# Patient Record
Sex: Male | Born: 1965 | Race: White | Hispanic: No | Marital: Single | Smoking: Never smoker
Health system: Southern US, Community
[De-identification: ages and names within clinical notes are randomized; demographics above are authoritative.]

---

## 2011-03-07 DIAGNOSIS — Z820 Family history of epilepsy and other diseases of the nervous system: Secondary | ICD-10-CM | POA: Insufficient documentation

## 2012-02-29 DIAGNOSIS — K4090 Unilateral inguinal hernia, without obstruction or gangrene, not specified as recurrent: Secondary | ICD-10-CM | POA: Insufficient documentation

## 2012-03-02 DIAGNOSIS — N138 Other obstructive and reflux uropathy: Secondary | ICD-10-CM | POA: Insufficient documentation

## 2014-05-29 DIAGNOSIS — G4733 Obstructive sleep apnea (adult) (pediatric): Secondary | ICD-10-CM | POA: Insufficient documentation

## 2015-08-14 DIAGNOSIS — N4340 Spermatocele of epididymis, unspecified: Secondary | ICD-10-CM | POA: Insufficient documentation

## 2018-04-06 ENCOUNTER — Other Ambulatory Visit: Payer: Self-pay | Admitting: Family Medicine

## 2018-04-06 DIAGNOSIS — R1310 Dysphagia, unspecified: Secondary | ICD-10-CM

## 2018-04-24 ENCOUNTER — Ambulatory Visit
Admission: RE | Admit: 2018-04-24 | Discharge: 2018-04-24 | Disposition: A | Discharge status: Home / Self Care | Payer: BLUE CROSS/BLUE SHIELD | Source: Ambulatory Visit | Attending: Family Medicine | Admitting: Family Medicine

## 2018-04-24 DIAGNOSIS — R1310 Dysphagia, unspecified: Secondary | ICD-10-CM | POA: Insufficient documentation

## 2018-04-26 DIAGNOSIS — K2270 Barrett's esophagus without dysplasia: Secondary | ICD-10-CM | POA: Insufficient documentation

## 2019-08-16 ENCOUNTER — Other Ambulatory Visit: Payer: Self-pay

## 2019-08-16 ENCOUNTER — Ambulatory Visit: Payer: BC Managed Care – PPO | Admitting: Urology

## 2019-08-16 ENCOUNTER — Encounter: Payer: Self-pay | Admitting: Urology

## 2019-08-16 VITALS — BP 128/88 | HR 76 | Ht 74.0 in | Wt 250.0 lb

## 2019-08-16 DIAGNOSIS — E291 Testicular hypofunction: Secondary | ICD-10-CM

## 2019-08-16 MED ORDER — CLOMIPHENE CITRATE 50 MG PO TABS: 25.0000 mg | ORAL_TABLET | Freq: Every day | ORAL | 2 refills | Status: DC

## 2019-08-16 NOTE — Progress Notes (Signed)
   08/16/2019 2:07 PM   Connor Stephens 11-Aug-1965 [id number]  Referring provider: Jerl Mina, MD [address] [facility] [place],  [place] [address]  Chief Complaint  Patient presents with  . Other    HPI: Connor Stephens is a 54 y.o. male who presents to establish local urologic care.  He has a history of symptomatic hypogonadism with symptoms of tiredness, fatigue and decreased libido.  He was initially followed by Dr. Achilles Dunk in [place] and then [facility] and was started on clomiphene with significant improvement in his testosterone levels and symptoms.  After Dr. Achilles Dunk left the area he was seeing Dr. Lindley Magnus in [facility] who recently relocated to [facility].  He has been off Clomid for approximately 3 months and does have recurrent symptoms.  He has mild to moderate lower urinary tract symptoms which are not bothersome enough that he desires treatment.  Denies dysuria or gross hematuria.  No flank, abdominal or pelvic pain.  He has a strong family history of prostate cancer.  Baseline PSA between 1.1-1.7 and last PSA performed by his PCP December 2020 was 1.44.  He has used sildenafil for ED.   PMH: No past medical history on file.  Surgical History: . Hernia repair Right  as a Child  . Tonsillectomy   Home Medications:  Allergies as of 08/16/2019   Not on File     Medication List       Accurate as of August 16, 2019  2:07 PM. If you have any questions, ask your nurse or doctor.        aspirin 81 MG EC tablet Take by mouth.   clomiPHENE 50 MG tablet Commonly known as: CLOMID Take by mouth.   sildenafil 20 MG tablet Commonly known as: REVATIO TAKE 3 TABLETS (60 MG) BY MOUTH ONCE DAILY AS NEEDED 1 HOUR BEFORE SEX (MAX 3 TABLETS/DAY)       Allergies: Not on File  Family History: No family history on file.  Social History:  has no history on file for tobacco, alcohol, and drug.   Physical Exam: BP 128/88   Pulse 76   Ht 6\' 2"  (1.88 m)   Wt 250 lb  (113.4 kg)   BMI 32.10 kg/m   Constitutional:  Alert and oriented, No acute distress. HEENT: [place] AT, moist mucus membranes.  Trachea midline, no masses. Cardiovascular: No clubbing, cyanosis, or edema. Respiratory: Normal respiratory effort, no increased work of breathing. GU: Prostate 30 g, smooth without nodules Skin: No rashes, bruises or suspicious lesions. Neurologic: Grossly intact, no focal deficits, moving all 4 extremities. Psychiatric: Normal mood and affect.   Assessment & Plan:    - Hypogonadism He desires to restart and continue clomiphene citrate.  Rx sent to pharmacy.  Since he has been off replacement for approximately 3 months a testosterone level was drawn today.  Lab visit 6 months for testosterone/hematocrit and office visit 1 year for same.  His PSA is checked annually by his PCP.   , MD  [facility] [address], [address] [place], [place] [phone]

## 2019-08-17 LAB — TESTOSTERONE: Testosterone: 223 ng/dL — ABNORMAL LOW (ref 264–916)

## 2019-08-20 ENCOUNTER — Telehealth: Payer: Self-pay | Admitting: *Deleted

## 2019-08-20 NOTE — Telephone Encounter (Signed)
-----   Message from Riki Altes, MD sent at 08/19/2019 10:00 AM EDT ----- Testosterone level was low at 223.  Clomid Rx was sent to pharmacy.  Follow-up as scheduled

## 2019-08-22 NOTE — Telephone Encounter (Signed)
Patient notified

## 2019-11-04 IMAGING — RF DG ESOPHAGUS
6 series · 15 of 16 positions shown · non-contrast
Comparison: None.

CLINICAL DATA: Dysphagia.

EXAM:
ESOPHOGRAM / BARIUM SWALLOW / BARIUM TABLET STUDY
TECHNIQUE: Combined double contrast and single contrast examination performed
using effervescent crystals, thick barium liquid, and thin barium
liquid. The patient was observed with fluoroscopy swallowing a 13 mm
barium sulphate tablet.
FLUOROSCOPY TIME:  Fluoroscopy Time:  54 seconds

[Series 1: cp_standard · 0.26mm/px · 4 of 46 frames shown (1 of 3)]
[frame 7/46]
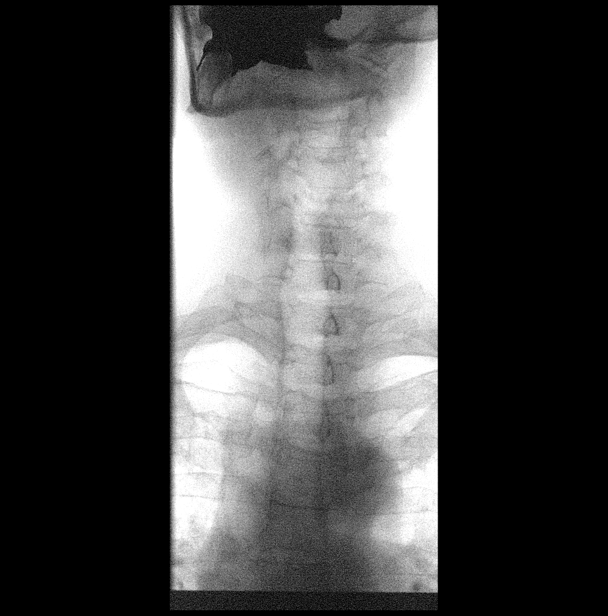
[frame 14/46]
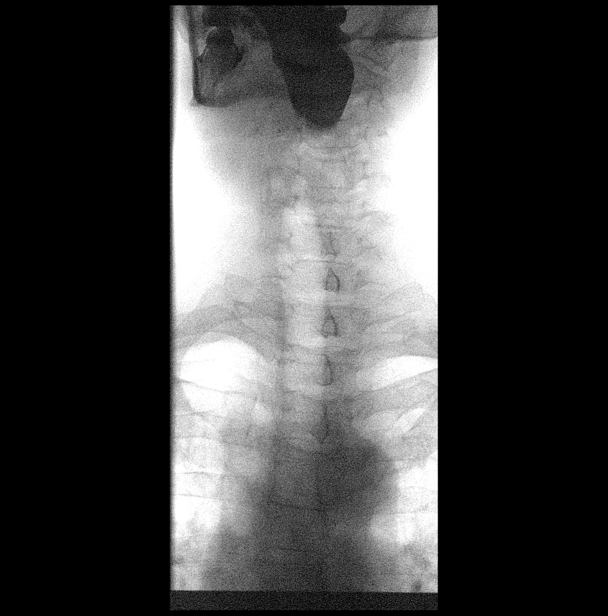
[frame 24/46]
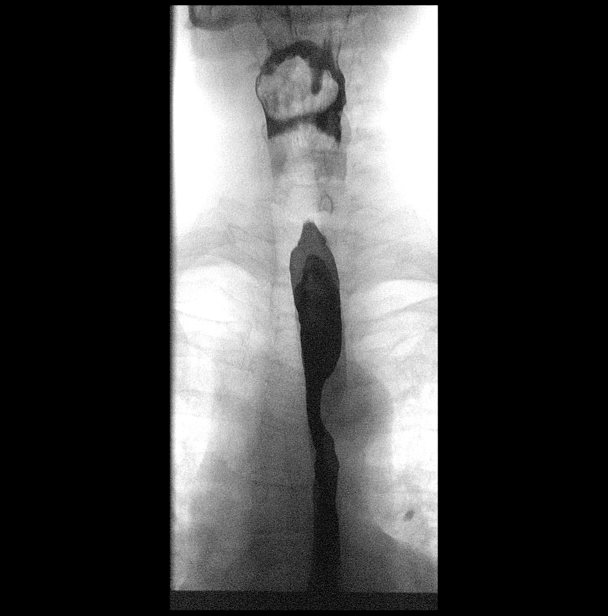
[frame 40/46]
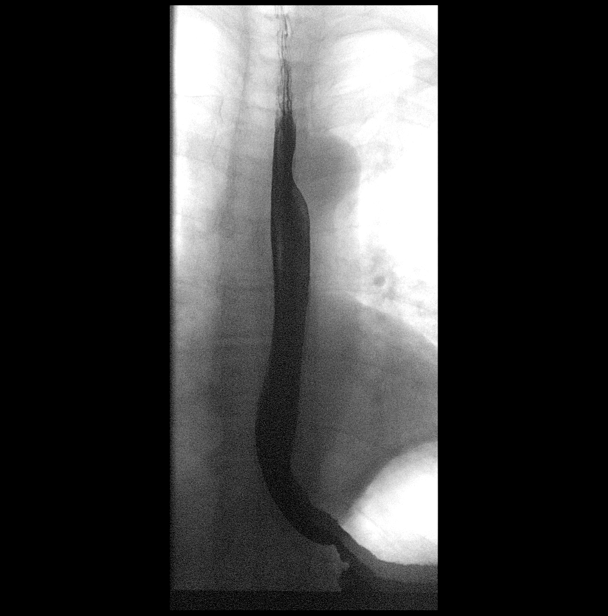

[Series 2: fluoro_barium 2fps_bw · 0.17mm/px · 2 of 2 frames shown (1 of 3)]
[frame 1/2]
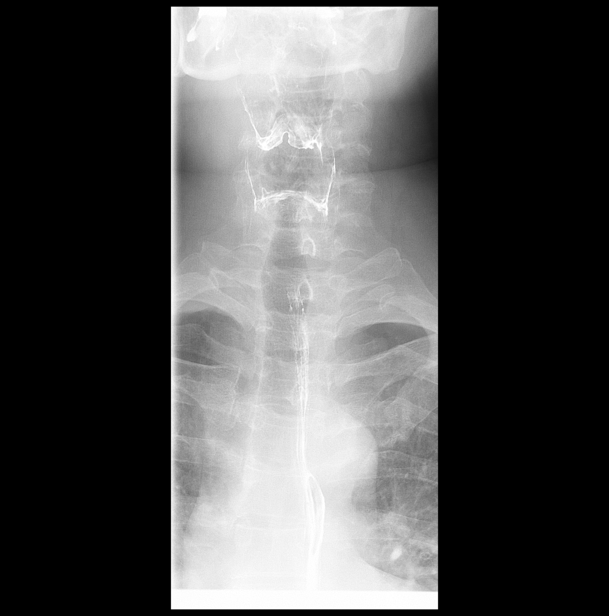
[frame 2/2]
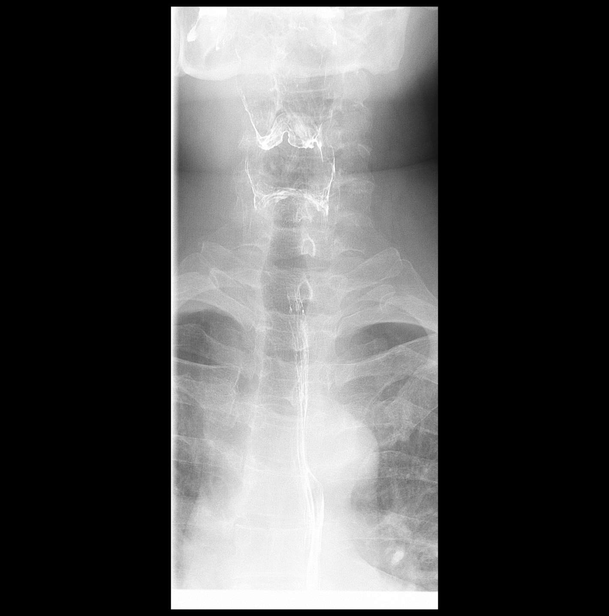

[Series 3: cp_standard · 0.26mm/px · 3 of 49 frames shown (2 of 3)]
[frame 8/49]
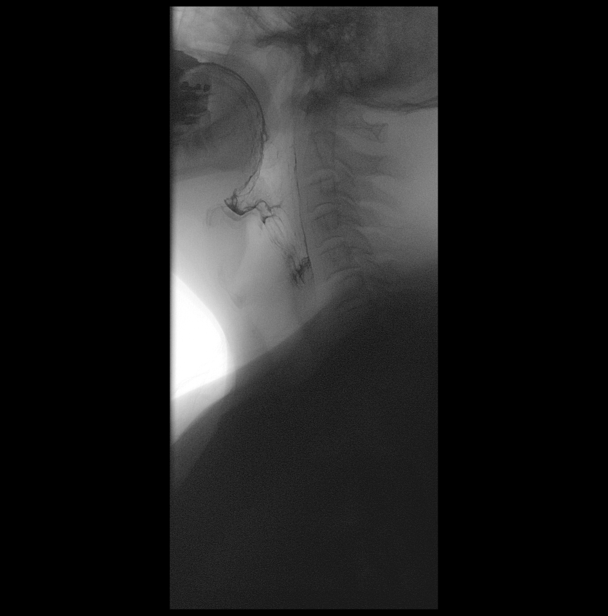
[frame 25/49]
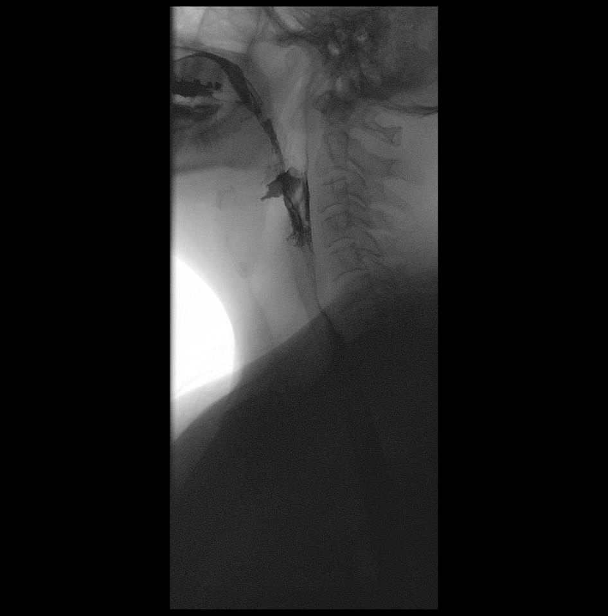
[frame 42/49]
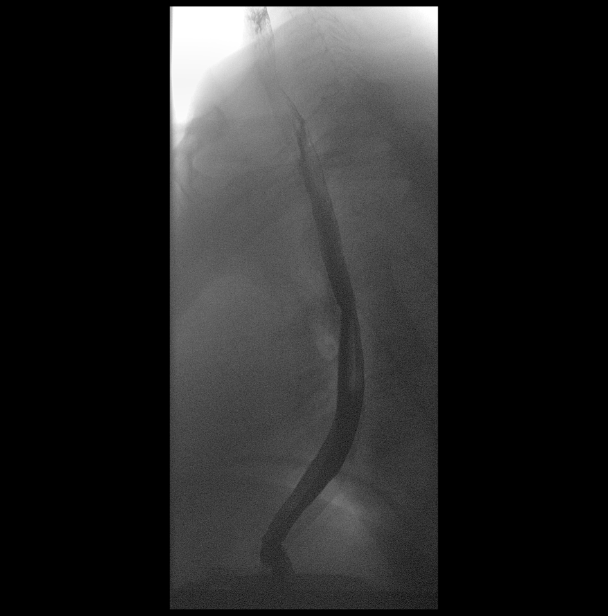

[Series 4: fluoro_barium 2fps_bw · 0.18mm/px · 1 of 1 slices shown (2 of 3)]
[im 1/1]
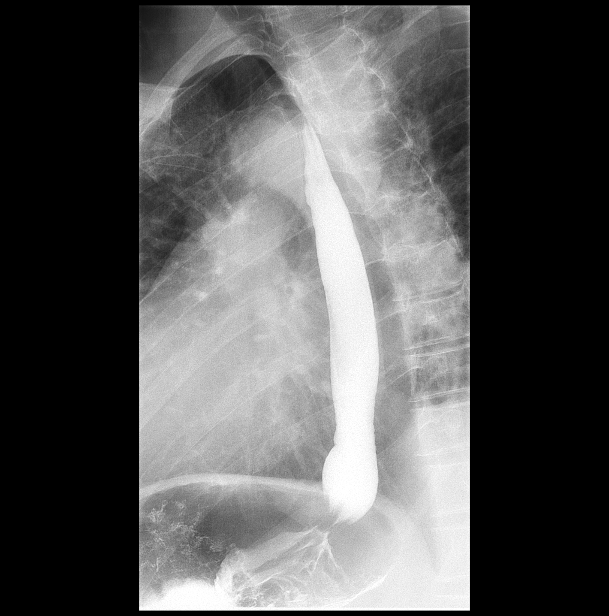

[Series 5: fluoro_barium 2fps_bw · 0.18mm/px · 1 of 1 slices shown (3 of 3)]
[im 1/1]
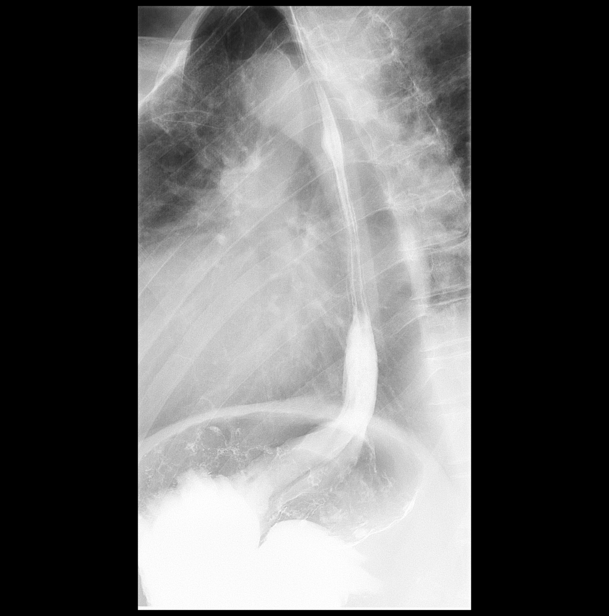

[Series 6: cp_standard · 0.27mm/px · 4 of 43 frames shown (3 of 3)]
[frame 7/43]
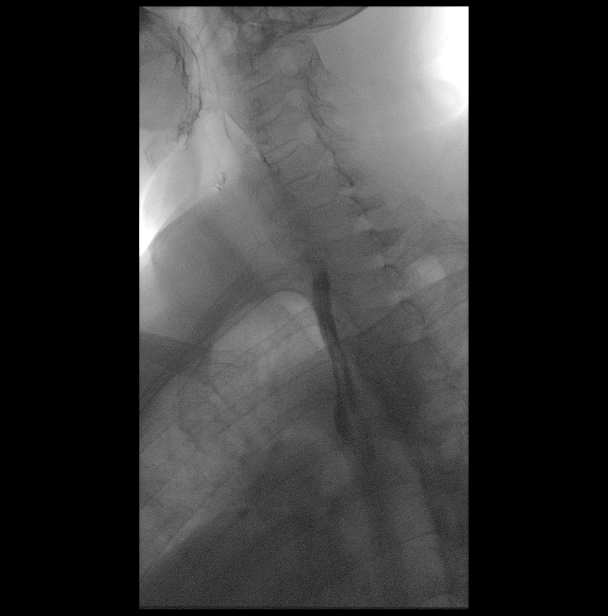
[frame 19/43]
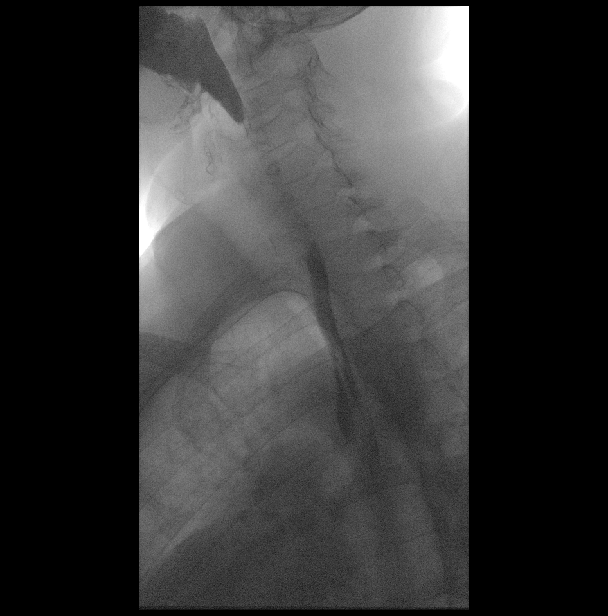
[frame 22/43]
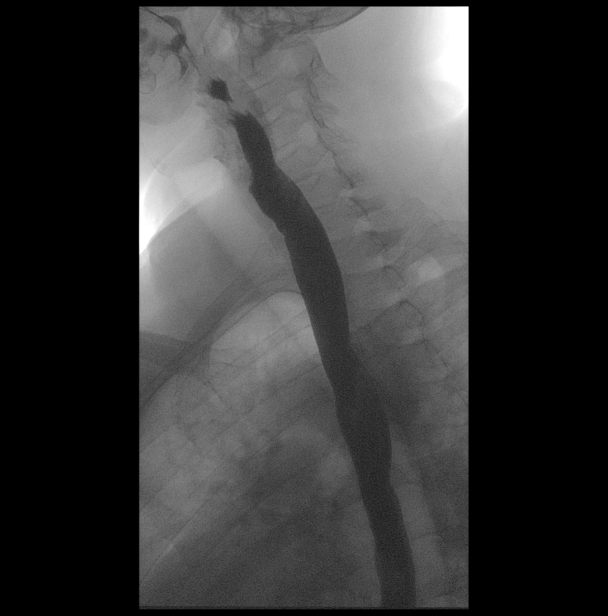
[frame 37/43]
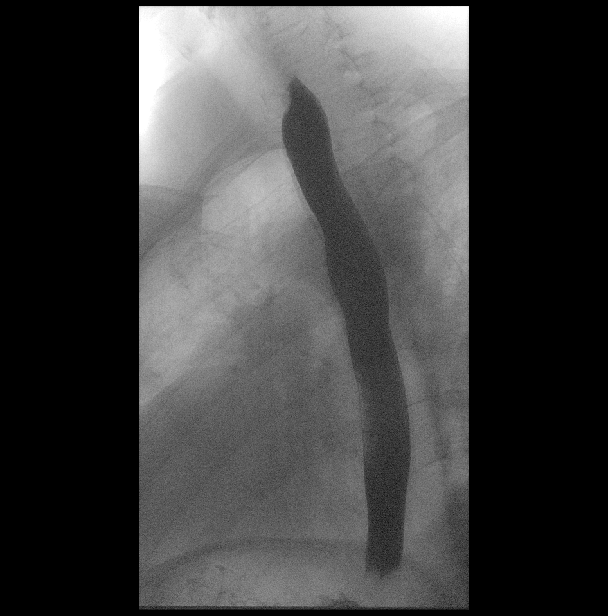

[15 of 16 positions shown; findings below may reference images not displayed]

FINDINGS: The mucosa and motility of the esophagus are normal. There is no
hiatal hernia even with Valsalva maneuver. A 13 mm barium tablet
passed immediately from the mouth to the stomach with no delay. No
stricture or mass lesion.

There is no evidence of aspiration or laryngeal penetration.
IMPRESSION: Normal barium esophagram.

## 2020-02-12 ENCOUNTER — Other Ambulatory Visit: Payer: Self-pay | Admitting: Family Medicine

## 2020-02-12 DIAGNOSIS — E291 Testicular hypofunction: Secondary | ICD-10-CM

## 2020-02-13 ENCOUNTER — Other Ambulatory Visit: Payer: Self-pay | Admitting: Urology

## 2020-02-15 ENCOUNTER — Other Ambulatory Visit: Payer: Self-pay

## 2020-02-15 ENCOUNTER — Other Ambulatory Visit: Payer: BC Managed Care – PPO

## 2020-02-15 DIAGNOSIS — E291 Testicular hypofunction: Secondary | ICD-10-CM

## 2020-02-16 LAB — TESTOSTERONE: Testosterone: 546 ng/dL (ref 264–916)

## 2020-02-16 LAB — HEMATOCRIT: Hematocrit: 42.8 % (ref 37.5–51.0)

## 2020-02-18 ENCOUNTER — Telehealth: Payer: Self-pay | Admitting: *Deleted

## 2020-02-18 ENCOUNTER — Encounter: Payer: Self-pay | Admitting: *Deleted

## 2020-02-18 NOTE — Telephone Encounter (Addendum)
-----   Message from Riki Altes, MD sent at 02/16/2020 11:19 AM EDT ----- Testosterone level looks good at 546.  Hematocrit was normal.  Follow-up April 2022 as scheduled  Send my chart message

## 2020-02-19 LAB — PSA: Prostate Specific Ag, Serum: 1.5 ng/mL (ref 0.0–4.0)

## 2020-02-19 LAB — SPECIMEN STATUS REPORT

## 2020-05-14 ENCOUNTER — Other Ambulatory Visit: Payer: Self-pay | Admitting: Nephrology

## 2020-05-14 DIAGNOSIS — R8290 Unspecified abnormal findings in urine: Secondary | ICD-10-CM

## 2020-05-14 DIAGNOSIS — N179 Acute kidney failure, unspecified: Secondary | ICD-10-CM

## 2020-05-21 ENCOUNTER — Ambulatory Visit
Admission: RE | Admit: 2020-05-21 | Discharge: 2020-05-21 | Disposition: A | Discharge status: Home / Self Care | Payer: BC Managed Care – PPO | Source: Ambulatory Visit | Attending: Nephrology | Admitting: Nephrology

## 2020-05-21 ENCOUNTER — Other Ambulatory Visit: Payer: Self-pay

## 2020-05-21 DIAGNOSIS — R8290 Unspecified abnormal findings in urine: Secondary | ICD-10-CM | POA: Insufficient documentation

## 2020-05-21 DIAGNOSIS — N179 Acute kidney failure, unspecified: Secondary | ICD-10-CM | POA: Diagnosis present

## 2020-08-15 ENCOUNTER — Ambulatory Visit: Payer: Self-pay | Admitting: Urology

## 2020-08-21 ENCOUNTER — Other Ambulatory Visit: Payer: Self-pay

## 2020-08-21 ENCOUNTER — Other Ambulatory Visit: Payer: BC Managed Care – PPO

## 2020-08-21 DIAGNOSIS — E291 Testicular hypofunction: Secondary | ICD-10-CM

## 2020-08-22 LAB — HEMATOCRIT: Hematocrit: 40.7 % (ref 37.5–51.0)

## 2020-08-22 LAB — PSA: Prostate Specific Ag, Serum: 1.4 ng/mL (ref 0.0–4.0)

## 2020-08-22 LAB — TESTOSTERONE: Testosterone: 526 ng/dL (ref 264–916)

## 2020-08-24 NOTE — Progress Notes (Signed)
   08/24/2020  10:40 PM   Connor Stephens 04-Feb-1966 [id number]  Referring provider: Jerl Mina, MD [address] [facility] [place],  [place] [address] No chief complaint on file.   Chief complaint: Follow-up  HPI: Connor Stephens is a 55 y.o. male presents for follow-up of hypogonadism.   Initially seen 08/16/2019 for hypogonadism; refer to note for details  Had previously been on Clomid and elected to restart  Testosterone level prior to starting was 223  Follow-up testosterone level October 2021 546  Comprehensive metabolic panel December 2021 with normal LFTs  Labs 08/21/2020: Testosterone 526 ng/dL; PSA 1.4; hematocrit 56.3  He has good energy level and libido   PMH: No past medical history on file.  Surgical History: No past surgical history on file.  Home Medications:  Allergies as of 08/25/2020   Not on File     Medication List       Accurate as of Aug 24, 2020 10:40 PM. If you have any questions, ask your nurse or doctor.        aspirin 81 MG EC tablet Take by mouth.   clomiPHENE 50 MG tablet Commonly known as: CLOMID TAKE 1/2 TABLET BY MOUTH DAILY   sildenafil 20 MG tablet Commonly known as: REVATIO TAKE 3 TABLETS (60 MG) BY MOUTH ONCE DAILY AS NEEDED 1 HOUR BEFORE SEX (MAX 3 TABLETS/DAY)       Allergies: Not on File  Family History: No family history on file.  Social History:   has no history on file for tobacco use, alcohol use, and drug use.  ROS: Pertinent ROS in HPI.  Physical Exam: There were no vitals taken for this visit.  Constitutional:  Alert and oriented, No acute distress. HEENT: [place] AT, moist mucus membranes.  Trachea midline, no masses. Cardiovascular: No clubbing, cyanosis, or edema. Respiratory: Normal respiratory effort, no increased work of breathing. GU: Prostate 30 g, flat smooth no nodules Skin: No rashes, bruises or suspicious lesions. Neurologic: Grossly intact, no focal deficits, moving all 4  extremities. Psychiatric: Normal mood and affect.   Assessment & Plan:    1.  Hypogonadism  Doing well on Clomid  Refill sent to pharmacy  15-month lab visit testosterone/hematocrit  Continue annual follow-up  2.  Family history prostate cancer  Annual PSA/DRE   Irineo Axon, MD  [facility] [address] [place], [place] [address] [phone]

## 2020-08-25 ENCOUNTER — Encounter: Payer: Self-pay | Admitting: Urology

## 2020-08-25 ENCOUNTER — Other Ambulatory Visit: Payer: Self-pay

## 2020-08-25 ENCOUNTER — Ambulatory Visit: Payer: BC Managed Care – PPO | Admitting: Urology

## 2020-08-25 VITALS — BP 136/95 | HR 72 | Ht 72.0 in | Wt 255.0 lb

## 2020-08-25 DIAGNOSIS — Z8042 Family history of malignant neoplasm of prostate: Secondary | ICD-10-CM | POA: Diagnosis not present

## 2020-08-25 DIAGNOSIS — E291 Testicular hypofunction: Secondary | ICD-10-CM

## 2020-08-25 MED ORDER — CLOMIPHENE CITRATE 50 MG PO TABS: 25.0000 mg | ORAL_TABLET | Freq: Every day | ORAL | 2 refills | Status: DC

## 2020-10-29 DIAGNOSIS — N281 Cyst of kidney, acquired: Secondary | ICD-10-CM | POA: Insufficient documentation

## 2021-01-11 ENCOUNTER — Other Ambulatory Visit: Payer: Self-pay | Admitting: Urology

## 2021-02-23 ENCOUNTER — Encounter: Payer: Self-pay | Admitting: Urology

## 2021-02-24 ENCOUNTER — Other Ambulatory Visit: Payer: Self-pay | Admitting: Family Medicine

## 2021-02-24 DIAGNOSIS — Z8042 Family history of malignant neoplasm of prostate: Secondary | ICD-10-CM

## 2021-02-24 DIAGNOSIS — E291 Testicular hypofunction: Secondary | ICD-10-CM

## 2021-02-25 ENCOUNTER — Telehealth: Payer: Self-pay | Admitting: Urology

## 2021-02-25 ENCOUNTER — Other Ambulatory Visit: Payer: Self-pay

## 2021-02-25 ENCOUNTER — Other Ambulatory Visit: Payer: Self-pay | Admitting: *Deleted

## 2021-02-25 ENCOUNTER — Other Ambulatory Visit: Payer: BC Managed Care – PPO

## 2021-02-25 DIAGNOSIS — Z8042 Family history of malignant neoplasm of prostate: Secondary | ICD-10-CM

## 2021-02-25 DIAGNOSIS — E291 Testicular hypofunction: Secondary | ICD-10-CM

## 2021-02-25 MED ORDER — CLOMIPHENE CITRATE 50 MG PO TABS: 25.0000 mg | ORAL_TABLET | Freq: Every day | ORAL | 2 refills | Status: DC

## 2021-02-25 NOTE — Telephone Encounter (Signed)
Pt would like for his primary pharmacy to be [facility] on [address], he currently has Clomid at [facility] and would like it changed to [facility].

## 2021-02-26 LAB — HEMATOCRIT: Hematocrit: 42.8 % (ref 37.5–51.0)

## 2021-02-26 LAB — PSA: Prostate Specific Ag, Serum: 1.7 ng/mL (ref 0.0–4.0)

## 2021-02-26 LAB — TESTOSTERONE: Testosterone: 528 ng/dL (ref 264–916)

## 2021-02-27 ENCOUNTER — Encounter: Payer: Self-pay | Admitting: *Deleted

## 2021-05-27 DIAGNOSIS — R809 Proteinuria, unspecified: Secondary | ICD-10-CM | POA: Insufficient documentation

## 2021-08-20 ENCOUNTER — Other Ambulatory Visit: Payer: Self-pay | Admitting: Family Medicine

## 2021-08-20 ENCOUNTER — Encounter: Payer: Self-pay | Admitting: Urology

## 2021-08-20 DIAGNOSIS — E291 Testicular hypofunction: Secondary | ICD-10-CM

## 2021-08-20 DIAGNOSIS — Z8042 Family history of malignant neoplasm of prostate: Secondary | ICD-10-CM

## 2021-08-21 ENCOUNTER — Other Ambulatory Visit: Payer: BC Managed Care – PPO

## 2021-08-21 DIAGNOSIS — E291 Testicular hypofunction: Secondary | ICD-10-CM

## 2021-08-21 DIAGNOSIS — Z8042 Family history of malignant neoplasm of prostate: Secondary | ICD-10-CM

## 2021-08-22 LAB — TESTOSTERONE: Testosterone: 623 ng/dL (ref 264–916)

## 2021-08-22 LAB — PSA: Prostate Specific Ag, Serum: 1.3 ng/mL (ref 0.0–4.0)

## 2021-08-22 LAB — HEMATOCRIT: Hematocrit: 40.3 % (ref 37.5–51.0)

## 2021-08-26 ENCOUNTER — Encounter: Payer: Self-pay | Admitting: Urology

## 2021-08-26 ENCOUNTER — Ambulatory Visit: Payer: BC Managed Care – PPO | Admitting: Urology

## 2021-08-26 VITALS — BP 121/82 | HR 65 | Ht 74.0 in | Wt 245.0 lb

## 2021-08-26 DIAGNOSIS — Z8042 Family history of malignant neoplasm of prostate: Secondary | ICD-10-CM | POA: Diagnosis not present

## 2021-08-26 DIAGNOSIS — R350 Frequency of micturition: Secondary | ICD-10-CM

## 2021-08-26 DIAGNOSIS — R399 Unspecified symptoms and signs involving the genitourinary system: Secondary | ICD-10-CM

## 2021-08-26 DIAGNOSIS — E291 Testicular hypofunction: Secondary | ICD-10-CM | POA: Diagnosis not present

## 2021-08-26 DIAGNOSIS — R3915 Urgency of urination: Secondary | ICD-10-CM | POA: Diagnosis not present

## 2021-08-26 DIAGNOSIS — R3912 Poor urinary stream: Secondary | ICD-10-CM | POA: Diagnosis not present

## 2021-08-26 LAB — BLADDER SCAN AMB NON-IMAGING: Scan Result: 18

## 2021-08-26 MED ORDER — CLOMIPHENE CITRATE 50 MG PO TABS: 25.0000 mg | ORAL_TABLET | Freq: Every day | ORAL | 0 refills | Status: DC

## 2021-08-26 NOTE — Progress Notes (Signed)
? ?  08/26/2021  ?1:02 PM  ? ?Connor Stephens ?[birth year] ?[id number] ? ?Referring provider: [place] Pink, MD ?[place] ?[facility] ?[place],  [place] [address] ?Chief Complaint  ?Patient presents with  ? Hypogonadism  ? ? ?Chief complaint: Follow-up ? ?HPI: ?Connor Stephens is a 56 y.o. male presents for follow-up of hypogonadism. ? ?Initially seen 08/16/2019 for hypogonadism; refer to note for details ?Labs 08/21/2021: Testosterone 623 ng/dL; PSA 1.3; hematocrit 40.3 ?He has good energy level and libido ?Since last years visit has noted some increased urinary frequency, urgency and decreased urinary stream ?IPSS 13/35 ? ? ?PMH ?Chronic kidney disease stage 3A ([place])  ?GERD ? ?Surgical History: ?None ? ?Home Medications:  ?Allergies as of 08/26/2021   ?No Known Allergies ?  ? ?  ?Medication List  ?  ? ?  ? Accurate as of Aug 26, 2021  1:02 PM. If you have any questions, ask your nurse or doctor.  ?  ?  ? ?  ? ?aspirin 81 MG EC tablet ?Take by mouth. ?  ?clomiPHENE 50 MG tablet ?Commonly known as: CLOMID ?Take 0.5 tablets (25 mg total) by mouth daily. ?  ?sildenafil 20 MG tablet ?Commonly known as: REVATIO ?TAKE 3 TABLETS (60 MG) BY MOUTH ONCE DAILY AS NEEDED 1 HOUR BEFORE SEX (MAX 3 TABLETS/DAY) ?  ? ?  ? ? ?Allergies: ?No Known Allergies ? ?Family History: ?No family history on file. ? ?Social History:  ? reports that he has never smoked. He has never used smokeless tobacco. He reports current alcohol use. No history on file for drug use. ? ?ROS: ?Pertinent ROS in HPI. ? ?Physical Exam: ?BP 121/82   Pulse 65   Ht 6\' 2"  (1.88 m)   Wt 245 lb (111.1 kg)   BMI 31.46 kg/m?   ?Constitutional:  Alert and oriented, No acute distress. ?HEENT: [place] AT, moist mucus membranes.  Trachea midline, no masses. ?Cardiovascular: No clubbing, cyanosis, or edema. ?Respiratory: Normal respiratory effort, no increased work of breathing. ?GU: Prostate 30 g, flat smooth no nodules ?Skin: No rashes, bruises or suspicious  lesions. ?Neurologic: Grossly intact, no focal deficits, moving all 4 extremities. ?Psychiatric: Normal mood and affect. ? ? ?Assessment & Plan:   ? ?1.  Hypogonadism ?Doing well on Clomid ?Refill sent to pharmacy ?79-month lab visit testosterone/hematocrit ?Continue annual follow-up ? ?2.  Family history prostate cancer ?Annual PSA/DRE ? ?3.  Lower urinary tract symptoms ?We discussed the most common etiology would be BPH or increased bladder neck tone ?Symptoms are currently not bothersome enough that he desires medical management ?PVR today was 18 cc ? ? ?John Giovanni, MD ? ?[facility] ?[address] ?[place], [place] [address] ?[phone]  ?

## 2021-08-27 ENCOUNTER — Encounter: Payer: Self-pay | Admitting: Urology

## 2021-11-03 ENCOUNTER — Other Ambulatory Visit: Payer: Self-pay | Admitting: Unknown Physician Specialty

## 2021-11-03 ENCOUNTER — Ambulatory Visit
Admission: RE | Admit: 2021-11-03 | Discharge: 2021-11-03 | Disposition: A | Discharge status: Home / Self Care | Payer: BC Managed Care – PPO | Source: Ambulatory Visit | Attending: Unknown Physician Specialty | Admitting: Unknown Physician Specialty

## 2021-11-03 DIAGNOSIS — R059 Cough, unspecified: Secondary | ICD-10-CM

## 2021-11-23 ENCOUNTER — Other Ambulatory Visit: Payer: Self-pay | Admitting: Unknown Physician Specialty

## 2021-11-23 DIAGNOSIS — J019 Acute sinusitis, unspecified: Secondary | ICD-10-CM

## 2021-11-26 ENCOUNTER — Ambulatory Visit
Admission: RE | Admit: 2021-11-26 | Discharge: 2021-11-26 | Disposition: A | Discharge status: Home / Self Care | Payer: BC Managed Care – PPO | Source: Ambulatory Visit | Attending: Unknown Physician Specialty | Admitting: Unknown Physician Specialty

## 2021-11-26 DIAGNOSIS — J019 Acute sinusitis, unspecified: Secondary | ICD-10-CM

## 2022-01-21 ENCOUNTER — Other Ambulatory Visit: Payer: Self-pay | Admitting: Urology

## 2022-01-22 ENCOUNTER — Encounter: Payer: Self-pay | Admitting: Urology

## 2022-01-24 MED ORDER — CLOMIPHENE CITRATE 50 MG PO TABS
25.0000 mg | ORAL_TABLET | Freq: Every day | ORAL | 0 refills | Status: DC
  Filled 2022-01-24 – 2022-01-26 (×2): qty 90, 180d supply, fill #0
  Filled 2022-02-10: qty 15, 30d supply, fill #0

## 2022-01-25 ENCOUNTER — Other Ambulatory Visit (HOSPITAL_COMMUNITY): Payer: Self-pay

## 2022-01-26 ENCOUNTER — Other Ambulatory Visit (HOSPITAL_COMMUNITY): Payer: Self-pay

## 2022-01-28 ENCOUNTER — Other Ambulatory Visit (HOSPITAL_COMMUNITY): Payer: Self-pay

## 2022-02-10 ENCOUNTER — Other Ambulatory Visit (HOSPITAL_COMMUNITY): Payer: Self-pay

## 2022-02-10 ENCOUNTER — Encounter: Payer: Self-pay | Admitting: Urology

## 2022-02-10 MED ORDER — CLOMIPHENE CITRATE 50 MG PO TABS: 25.0000 mg | ORAL_TABLET | Freq: Every day | ORAL | 0 refills | Status: DC

## 2022-02-11 NOTE — Telephone Encounter (Signed)
Spoke with [place] and pt gets at 25mg  compounded prescription and not the 50mg . Per pharmacy on next rx, please add in comments that he will get 25mg .  Spoke with patient and advised results

## 2022-02-26 ENCOUNTER — Other Ambulatory Visit: Payer: Self-pay

## 2022-02-26 DIAGNOSIS — E291 Testicular hypofunction: Secondary | ICD-10-CM

## 2022-02-27 LAB — TESTOSTERONE: Testosterone: 471 ng/dL (ref 264–916)

## 2022-02-27 LAB — HEMATOCRIT: Hematocrit: 39.7 % (ref 37.5–51.0)

## 2022-03-01 ENCOUNTER — Encounter: Payer: Self-pay | Admitting: *Deleted

## 2022-03-03 ENCOUNTER — Other Ambulatory Visit: Payer: Self-pay

## 2022-03-04 ENCOUNTER — Other Ambulatory Visit: Payer: Self-pay

## 2022-03-04 ENCOUNTER — Emergency Department
Admission: EM | Admit: 2022-03-04 | Discharge: 2022-03-04 | Disposition: A | Discharge status: Home / Self Care | Payer: BC Managed Care – PPO | Attending: Emergency Medicine | Admitting: Emergency Medicine

## 2022-03-04 ENCOUNTER — Other Ambulatory Visit: Payer: Self-pay | Admitting: *Deleted

## 2022-03-04 DIAGNOSIS — S51851A Open bite of right forearm, initial encounter: Secondary | ICD-10-CM | POA: Insufficient documentation

## 2022-03-04 DIAGNOSIS — E291 Testicular hypofunction: Secondary | ICD-10-CM

## 2022-03-04 DIAGNOSIS — Z203 Contact with and (suspected) exposure to rabies: Secondary | ICD-10-CM | POA: Insufficient documentation

## 2022-03-04 DIAGNOSIS — Z2914 Encounter for prophylactic rabies immune globin: Secondary | ICD-10-CM | POA: Insufficient documentation

## 2022-03-04 DIAGNOSIS — Z23 Encounter for immunization: Secondary | ICD-10-CM | POA: Insufficient documentation

## 2022-03-04 DIAGNOSIS — W5501XA Bitten by cat, initial encounter: Secondary | ICD-10-CM | POA: Insufficient documentation

## 2022-03-04 DIAGNOSIS — Y92007 Garden or yard of unspecified non-institutional (private) residence as the place of occurrence of the external cause: Secondary | ICD-10-CM | POA: Insufficient documentation

## 2022-03-04 MED ORDER — RABIES IMMUNE GLOBULIN 150 UNIT/ML IM INJ
20.0000 [IU]/kg | INJECTION | Freq: Once | INTRAMUSCULAR | Status: AC
  Administered 2022-03-04: 2250 [IU] via INTRAMUSCULAR
  Filled 2022-03-04: qty 20

## 2022-03-04 MED ORDER — TETANUS-DIPHTH-ACELL PERTUSSIS 5-2.5-18.5 LF-MCG/0.5 IM SUSY
0.5000 mL | PREFILLED_SYRINGE | Freq: Once | INTRAMUSCULAR | Status: AC
  Administered 2022-03-04: 0.5 mL via INTRAMUSCULAR
  Filled 2022-03-04: qty 0.5

## 2022-03-04 MED ORDER — RABIES VACCINE, PCEC IM SUSR
1.0000 mL | Freq: Once | INTRAMUSCULAR | Status: AC
  Administered 2022-03-04: 1 mL via INTRAMUSCULAR
  Filled 2022-03-04: qty 1

## 2022-03-04 MED ORDER — AMOXICILLIN-POT CLAVULANATE 875-125 MG PO TABS: 1.0000 | ORAL_TABLET | Freq: Two times a day (BID) | ORAL | 0 refills | Status: AC

## 2022-03-04 NOTE — ED Triage Notes (Signed)
Pt comes with c/o cat bit Monday. Pt states he was in backyard when cat attacked him. Pt has bite and scratches to right arm. 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

## 2022-03-04 NOTE — ED Notes (Signed)
See triage note  Presents with cat bite/scratches to right arm  States this happened on Monday  and it was a feral cat  Afebrile on arrival

## 2022-03-04 NOTE — Discharge Instructions (Signed)
Keep your wounds clean and dry, wash multiple times with soap and water.  Please take the antibiotics as prescribed.  Your tetanus vaccination was updated.  You were given the first dose of the rabies vaccination. You must get a repeat immunoglobulin on days 3, 7, and 14. Today is considered day 0. You may go to the [place] or [place] [facility]gent care for this:  [facility] urgent Care at [facility], open Monday through Friday 8am-4pm with online scheduling. OR [facility] at [facility] [address], [place]. Open Monday-Friday 8am-8pm, Saturday and Sunday 8am-4pm with online scheduling or walk in.  Return for any new, worsening, or change in symptoms or other concerns. It was a pleasure caring for you.

## 2022-03-04 NOTE — ED Provider Notes (Signed)
[facility] Provider Note    Event Date/Time   First MD Initiated Contact with Patient 03/04/22 1013     (approximate)   History   Animal Bite   HPI  Connor Stephens is a 56 y.o. male who presents today for evaluation of cat bite and cat scratch that occurred 3 days ago.  Patient reports that there was a cat in his backyard and he went to approach it and went to get left up and bit his right arm and also scratched him.  He reports that he went to his outpatient provider who instructed him to come to the emergency department for rabies vaccination.  He reports that his tetanus is more than 54 years old.  He denies fevers or chills.  Patient Active Problem List   Diagnosis Date Noted   Hypogonadism in male 08/25/2020   Family history of prostate cancer 08/25/2020          Physical Exam   Triage Vital Signs: ED Triage Vitals  Enc Vitals Group     BP 03/04/22 0959 129/84     Pulse Rate 03/04/22 0959 69     Resp 03/04/22 0959 18     Temp 03/04/22 0959 98.7 F (37.1 C)     Temp Source 03/04/22 0959 Oral     SpO2 03/04/22 0959 96 %     Weight 03/04/22 1013 244 lb 14.9 oz (111.1 kg)     Height 03/04/22 1013 6\' 2"  (1.88 m)     Head Circumference --      Peak Flow --      Pain Score 03/04/22 0951 1     Pain Loc --      Pain Edu? --      Excl. in GC? --     Most recent vital signs: Vitals:   03/04/22 0959  BP: 129/84  Pulse: 69  Resp: 18  Temp: 98.7 F (37.1 C)  SpO2: 96%    Physical Exam Vitals and nursing note reviewed.  Constitutional:      General: Awake and alert. No acute distress.    Appearance: Normal appearance. The patient is normal weight.  HENT:     Head: Normocephalic and atraumatic.     Mouth: Mucous membranes are moist.  Eyes:     General: PERRL. Normal EOMs        Right eye: No discharge.        Left eye: No discharge.     Conjunctiva/sclera: Conjunctivae normal.  Cardiovascular:     Rate and Rhythm: Normal rate  and regular rhythm.     Pulses: Normal pulses.     Heart sounds: Normal heart sounds Pulmonary:     Effort: Pulmonary effort is normal. No respiratory distress.     Breath sounds: Normal breath sounds.  Abdominal:     Abdomen is soft. There is no abdominal tenderness. Musculoskeletal:        General: No swelling. Normal range of motion.     Cervical back: Normal range of motion and neck supple.  Skin:    General: Skin is warm and dry.     Capillary Refill: Capillary refill takes less than 2 seconds.     Findings: Superficial appearing excoriations to right forearm.  No surrounding erythema.  No swelling or fluctuance.  No lymphangitis.  Normal intrinsic muscle function of the hand.  Normal strength and sensation throughout arm.  No lymphadenopathy noted. Neurological:     Mental Status:  The patient is awake and alert.      ED Results / Procedures / Treatments   Labs (all labs ordered are listed, but only abnormal results are displayed) Labs Reviewed - No data to display   EKG     RADIOLOGY     PROCEDURES:  Critical Care performed:   Procedures   MEDICATIONS ORDERED IN ED: Medications  rabies immune globulin (HYPERRAB/KEDRAB) injection 2,250 Units (2,250 Units Intramuscular Given 03/04/22 1156)  rabies vaccine (RABAVERT) injection 1 mL (1 mL Intramuscular Given 03/04/22 1154)  Tdap (BOOSTRIX) injection 0.5 mL (0.5 mLs Intramuscular Given 03/04/22 1157)     IMPRESSION / MDM / ASSESSMENT AND PLAN / ED COURSE  I reviewed the triage vital signs and the nursing notes.   Differential diagnosis includes, but is not limited to, cat scratch, cat bite, abrasion, cellulitis.  Patient is awake and alert, hemodynamically stable and afebrile.  Wounds appear to be superficial, no evidence of superimposed infection at this time, no surrounding erythema.  No fluctuance to suggest abscess.  Full and normal range of motion of all joints, do not suspect septic joint.  He did want the  rabies vaccination series.  Wounds were cleaned thoroughly.  He was educated on timeline for the rest of the rabies vaccination series.  His tetanus was updated.  He was started on prophylactic antibiotics.  We discussed return precautions and the importance of close outpatient follow-up.  Patient understands and agrees with plan.  He was discharged in stable condition.   Patient's presentation is most consistent with acute illness / injury with system symptoms.      FINAL CLINICAL IMPRESSION(S) / ED DIAGNOSES   Final diagnoses:  Need for rabies vaccination  Cat bite, initial encounter     Rx / DC Orders   ED Discharge Orders          Ordered    amoxicillin-clavulanate (AUGMENTIN) 875-125 MG tablet  2 times daily        03/04/22 1121             Note:  This document was prepared using Dragon voice recognition software and may include unintentional dictation errors.   Jackelyn Hoehn, PA-C 03/04/22 1326    Minna Antis, MD 03/04/22 1355

## 2022-03-05 ENCOUNTER — Other Ambulatory Visit: Payer: Self-pay

## 2022-03-05 DIAGNOSIS — E291 Testicular hypofunction: Secondary | ICD-10-CM

## 2022-03-06 LAB — CREATININE, SERUM
Creatinine, Ser: 1.08 mg/dL (ref 0.76–1.27)
eGFR: 82 mL/min/{1.73_m2} (ref >59)

## 2022-03-11 ENCOUNTER — Ambulatory Visit
Admission: RE | Admit: 2022-03-11 | Discharge: 2022-03-11 | Disposition: A | Discharge status: Home / Self Care | Payer: Self-pay | Source: Ambulatory Visit | Attending: Family Medicine | Admitting: Family Medicine

## 2022-03-11 ENCOUNTER — Other Ambulatory Visit: Payer: Self-pay

## 2022-03-11 ENCOUNTER — Emergency Department
Admission: EM | Admit: 2022-03-11 | Discharge: 2022-03-11 | Disposition: A | Discharge status: Home / Self Care | Payer: Self-pay | Attending: Emergency Medicine | Admitting: Emergency Medicine

## 2022-03-11 DIAGNOSIS — Z203 Contact with and (suspected) exposure to rabies: Secondary | ICD-10-CM | POA: Insufficient documentation

## 2022-03-11 DIAGNOSIS — Z23 Encounter for immunization: Secondary | ICD-10-CM | POA: Insufficient documentation

## 2022-03-11 MED ORDER — RABIES VACCINE, PCEC IM SUSR
1.0000 mL | Freq: Once | INTRAMUSCULAR | Status: AC
  Administered 2022-03-11: 1 mL via INTRAMUSCULAR
  Filled 2022-03-11: qty 1

## 2022-03-11 NOTE — Discharge Instructions (Addendum)
-  If you would like to resume the normal vaccination schedule, you may receive the next vaccination in 4 days.  If choosing to proceed as normal, please return in 7 days.  -Continue to take your antibiotics as prescribed.  Continue to wash the wound site daily with soap and water.  -Return to the emergency department anytime if you begin to experience any new or worsening symptoms.

## 2022-03-11 NOTE — ED Triage Notes (Signed)
Pt presents to ED via POV with c/o of needing rabies vaccine. Pt state she was kicked out of UC due to not having "his shots on day 3 and they said I needed to start all over". Pt upset he will occur a new co-pay for this.

## 2022-03-11 NOTE — ED Notes (Signed)
Pt denies any symptoms; pt in NAD; pt's R arm which was original site attacked by wild Paediatric nurse and currently WDL; pt here to clarify needs for and continue rabies shot sequence.

## 2022-03-11 NOTE — ED Notes (Signed)
Pt. Was not able to receive rabies vaccine d/t missing day 3 dose. Per Dr. Leonides Grills descretion the patient has to restart rabies series and the initial dose has to be given at the ER again.

## 2022-03-11 NOTE — ED Provider Notes (Signed)
[facility] Provider Note    Event Date/Time   First MD Initiated Contact with Patient 03/11/22 1125     (approximate)   History   Chief Complaint No chief complaint on file.   HPI Connor Stephens is a 56 y.o. male, no significant medical history, presents to the emergency department for rabies vaccination.  Patient states that he was seen here initially approximately 7 days ago following a cat bite from a stray cat in the neighborhood.  Injuries were fairly superficial.  When he was seen here initially, the wound was cleansed and he received his initial dosage of rabies vaccine and rabies immunoglobulin.  He was additionally placed on Augmentin, which he has been compliant with.  He was instructed to return to the emergency department or urgent care on days 3, 7, and 14, however he did not realize he needed to come on day 3.  He presented to urgent care today on day 7, however reportedly urgent care declined to give him his vaccine, saying that he needed to "start all over".  He presents here today to resume his rabies vaccination schedule.  Denies any active symptoms at this time.  History Limitations: No limitations.        Physical Exam  Triage Vital Signs: ED Triage Vitals  Enc Vitals Group     BP 03/11/22 1112 131/79     Pulse Rate 03/11/22 1112 81     Resp 03/11/22 1112 20     Temp 03/11/22 1112 98 F (36.7 C)     Temp Source 03/11/22 1112 Oral     SpO2 03/11/22 1112 98 %     Weight 03/11/22 1113 244 lb 14.9 oz (111.1 kg)     Height 03/11/22 1113 6\' 2"  (1.88 m)     Head Circumference --      Peak Flow --      Pain Score 03/11/22 1111 0     Pain Loc --      Pain Edu? --      Excl. in GC? --     Most recent vital signs: Vitals:   03/11/22 1112 03/11/22 1115  BP: 131/79 126/79  Pulse: 81 77  Resp: 20 19  Temp: 98 F (36.7 C)   SpO2: 98% 98%    General: Awake, NAD.  Skin: Warm, dry. No rashes or lesions.  Eyes: PERRL. Conjunctivae  normal.  CV: Good peripheral perfusion.  Resp: Normal effort.  Abd: Soft, non-tender. No distention.  Neuro: At baseline. No gross neurological deficits.  Musculoskeletal: Normal ROM of all extremities.  Focused Exam: No obvious wounds along the affected extremity.  Previous wounds appear to be healing well.  No surrounding warmth, erythema, or tenderness.  No active bleeding or discharge.  Physical Exam    ED Results / Procedures / Treatments  Labs (all labs ordered are listed, but only abnormal results are displayed) Labs Reviewed - No data to display   EKG N/A.    RADIOLOGY  ED Provider Interpretation: N/A.  No results found.  PROCEDURES:  Critical Care performed: N/A.  Procedures    MEDICATIONS ORDERED IN ED: Medications  rabies vaccine (RABAVERT) injection 1 mL (1 mL Intramuscular Given 03/11/22 1158)     IMPRESSION / MDM / ASSESSMENT AND PLAN / ED COURSE  I reviewed the triage vital signs and the nursing notes.  Differential diagnosis includes, but is not limited to, cellulitis, encounter for rabies vaccination.  Assessment/Plan Patient overall appears well.  There are no signs of any overlying infection from the initial wound site.  Patient has been compliant with his antibiotics.  In regards to his rabies vaccination, I did advise that the safest way to proceed would be to reinitiate the vaccine schedule starting with assumed a 3 today, however I do not feel like he needs to start as he is already received his rabies immunoglobulin and initial rabies vaccination.  Advised him that he could proceed along with rabies vaccination per day 7 and return in 7 days, though there would be some risk involved with doing so.  Patient states that he is willing to incur the risk is him a start all over per day 3.  Given the superficial nature of the injury, this will vaccination and immunoglobulin administration, and overall well-appearing  status, I believe this is reasonable as long as he was willing to incur the risks.  He states that he is.  Provided him with a rabies vaccination today and advised him to return to the emergency department in 7 day for his final vaccination.  Encouraged him to continue all with his antibiotic treatment and to wash the wound site daily with soap and water.  Will discharge.  Provided the patient with anticipatory guidance, return precautions, and educational material. Encouraged the patient to return to the emergency department at any time if they begin to experience any new or worsening symptoms. Patient expressed understanding and agreed with the plan.   Patient's presentation is most consistent with acute, uncomplicated illness.       FINAL CLINICAL IMPRESSION(S) / ED DIAGNOSES   Final diagnoses:  Encounter for repeat administration of rabies vaccination     Rx / DC Orders   ED Discharge Orders     None        Note:  This document was prepared using Dragon voice recognition software and may include unintentional dictation errors.   Teodoro Spray, [place] 03/11/22 [id number]    Lucillie Garfinkel, MD 03/12/22 [phone]

## 2022-03-18 ENCOUNTER — Other Ambulatory Visit: Payer: Self-pay

## 2022-03-18 ENCOUNTER — Emergency Department
Admission: EM | Admit: 2022-03-18 | Discharge: 2022-03-18 | Disposition: A | Discharge status: Home / Self Care | Payer: Self-pay | Attending: Emergency Medicine | Admitting: Emergency Medicine

## 2022-03-18 DIAGNOSIS — Z23 Encounter for immunization: Secondary | ICD-10-CM | POA: Insufficient documentation

## 2022-03-18 MED ORDER — RABIES VACCINE, PCEC IM SUSR
1.0000 mL | Freq: Once | INTRAMUSCULAR | Status: AC
  Administered 2022-03-18: 1 mL via INTRAMUSCULAR
  Filled 2022-03-18: qty 1

## 2022-03-18 NOTE — ED Triage Notes (Signed)
Per patient's report, this is his 3rd injection.

## 2022-03-18 NOTE — Discharge Instructions (Addendum)
Come  back Nov 30th for your fourth dose of vaccine giving you missed the day 3 dosing.

## 2022-03-18 NOTE — ED Notes (Signed)
Patient declined discharge vital signs. 

## 2022-03-18 NOTE — ED Notes (Signed)
Wounds have healed on right arm.

## 2022-03-18 NOTE — ED Provider Notes (Addendum)
[facility] Provider Note    Event Date/Time   First MD Initiated Contact with Patient 03/18/22 0940     (approximate)   History   Rabies Injection   HPI  TEOMAN GIRAUD is a 56 y.o. male  who comes in for need of rabies vaccine. Pt reports its his last covid vaccine.  Patient is here for his 14th day vaccination.  He missed day 3 and this was addressed on his visit on 11/16 but he did not want to restart the vaccination series at that time and want to proceed with day 7.  He states that he is only here for his 14th day vaccination.  Denies any other symptoms.   Physical Exam   Triage Vital Signs: ED Triage Vitals  Enc Vitals Group     BP 03/18/22 0942 134/77     Pulse Rate 03/18/22 0942 71     Resp 03/18/22 0942 16     Temp 03/18/22 0942 98.2 F (36.8 C)     Temp Source 03/18/22 0942 Oral     SpO2 03/18/22 0942 99 %     Weight 03/18/22 0947 260 lb (117.9 kg)     Height 03/18/22 0947 6\' 2"  (1.88 m)     Head Circumference --      Peak Flow --      Pain Score 03/18/22 0946 0     Pain Loc --      Pain Edu? --      Excl. in GC? --     Most recent vital signs: Vitals:   03/18/22 0942  BP: 134/77  Pulse: 71  Resp: 16  Temp: 98.2 F (36.8 C)  SpO2: 99%     General: Awake, no distress.  CV:  Good peripheral perfusion.  Resp:  Normal effort.  Abd:  No distention.  Other:  Well-healing abrasion to his right arm no redness, no erythema   ED Results / Procedures / Treatments   L MEDICATIONS ORDERED IN ED: Medications  rabies vaccine (RABAVERT) injection 1 mL (has no administration in time range)     IMPRESSION / MDM / ASSESSMENT AND PLAN / ED COURSE  I reviewed the triage vital signs and the nursing notes.   Patient's presentation is most consistent with acute, uncomplicated illness.   Patient here for his rabies vaccine.  Patient's did not adhere to the schedule but this was addressed on 11/16 and he understands the risk of  this.  He denies any symptoms at this time.  Patient will be given his last vaccine.  No evidence of cellulitis or other infection.  Given my concern of the missed day 3 doses I did discuss with pharmacist 12/16.  On his review the day 0 and day 3 of the most important but is still important to get a completed series even if he missed day 3 that he should come in for a fourth vaccine in 1 week to complete the series.    Therefore I did discuss with patient and he will come back on November 30 for his fourth dose     FINAL CLINICAL IMPRESSION(S) / ED DIAGNOSES   Final diagnoses:  Encounter for repeat administration of rabies vaccination     Rx / DC Orders   ED Discharge Orders     None        Note:  This document was prepared using Dragon voice recognition software and may include unintentional dictation errors.   December 02,  Alben Spittle, MD 03/18/22 1000    Concha Se, MD 03/18/22 1027

## 2022-03-25 ENCOUNTER — Emergency Department
Admission: EM | Admit: 2022-03-25 | Discharge: 2022-03-25 | Disposition: A | Discharge status: Home / Self Care | Payer: Self-pay | Attending: Emergency Medicine | Admitting: Emergency Medicine

## 2022-03-25 DIAGNOSIS — Z203 Contact with and (suspected) exposure to rabies: Secondary | ICD-10-CM | POA: Insufficient documentation

## 2022-03-25 DIAGNOSIS — Z23 Encounter for immunization: Secondary | ICD-10-CM | POA: Insufficient documentation

## 2022-03-25 LAB — SPECIMEN STATUS REPORT

## 2022-03-25 LAB — PSA: Prostate Specific Ag, Serum: 1 ng/mL (ref 0.0–4.0)

## 2022-03-25 MED ORDER — RABIES VACCINE, PCEC IM SUSR
1.0000 mL | Freq: Once | INTRAMUSCULAR | Status: AC
  Administered 2022-03-25: 1 mL via INTRAMUSCULAR
  Filled 2022-03-25: qty 1

## 2022-03-25 NOTE — Discharge Instructions (Signed)
You have completed the vaccine series for rabies if develop symptoms please return to the ER as soon as possible or any other concerns

## 2022-03-25 NOTE — ED Triage Notes (Signed)
Arrives for 4th rabies injection

## 2022-03-25 NOTE — ED Provider Notes (Signed)
   [facility] Provider Note    Event Date/Time   First MD Initiated Contact with Patient 03/25/22 1606     (approximate)   History   Rabies Injection   HPI  Connor Stephens is a 56 y.o. male who comes in today for his fourth rabies vaccine.  Patient reports feeling well.  He denies any symptoms.  He continues to have no infection from where the cat bit him.  He is here for his fourth rabies vaccine.  I was when he saw him on 10/23 and recommended a fourth vaccine given he missed a day 3 vaccine.   Physical Exam   Triage Vital Signs: ED Triage Vitals  Enc Vitals Group     BP 03/25/22 1542 119/86     Pulse Rate 03/25/22 1542 69     Resp 03/25/22 1542 18     Temp 03/25/22 1542 98.5 F (36.9 C)     Temp Source 03/25/22 1542 Oral     SpO2 03/25/22 1542 98 %     Weight 03/25/22 1506 259 lb 14.8 oz (117.9 kg)     Height 03/25/22 1506 6\' 2"  (1.88 m)     Head Circumference --      Peak Flow --      Pain Score 03/25/22 1506 0     Pain Loc --      Pain Edu? --      Excl. in GC? --     Most recent vital signs: Vitals:   03/25/22 1542  BP: 119/86  Pulse: 69  Resp: 18  Temp: 98.5 F (36.9 C)  SpO2: 98%     General: Awake, no distress.  CV:  Good peripheral perfusion.  Resp:  Normal effort.  Abd:  No distention.  Other:  No wound noted on the right arm   ED Results / Procedures / Treatments   La PROCEDURES:  Critical Care performed: No  Procedures   MEDICATIONS ORDERED IN ED: Medications - No data to display   IMPRESSION / MDM / ASSESSMENT AND PLAN / ED COURSE  I reviewed the triage vital signs and the nursing notes.   Patient's presentation is most consistent with acute, uncomplicated illness.   Patient comes in with concerns for rabies vaccine update.  Patient is well-appearing denies symptoms.  No evidence of infection on his arm.  He is here for his last dose of rabies.  We will update rabies vaccine and patient will return if  he develops any symptoms or any other concerns patient is not immunocompromise to need additional doses.    FINAL CLINICAL IMPRESSION(S) / ED DIAGNOSES   Final diagnoses:  Encounter for repeat administration of rabies vaccination     Rx / DC Orders   ED Discharge Orders     None        Note:  This document was prepared using Dragon voice recognition software and may include unintentional dictation errors.   03/27/22, MD 03/25/22 [phone]

## 2022-04-27 ENCOUNTER — Other Ambulatory Visit
Admission: RE | Admit: 2022-04-27 | Discharge: 2022-04-27 | Disposition: A | Discharge status: Home / Self Care | Payer: BLUE CROSS/BLUE SHIELD | Attending: Nephrology | Admitting: Nephrology

## 2022-04-27 DIAGNOSIS — R8290 Unspecified abnormal findings in urine: Secondary | ICD-10-CM | POA: Diagnosis not present

## 2022-04-27 DIAGNOSIS — N179 Acute kidney failure, unspecified: Secondary | ICD-10-CM | POA: Insufficient documentation

## 2022-04-27 DIAGNOSIS — N281 Cyst of kidney, acquired: Secondary | ICD-10-CM | POA: Insufficient documentation

## 2022-04-27 DIAGNOSIS — R809 Proteinuria, unspecified: Secondary | ICD-10-CM | POA: Diagnosis not present

## 2022-04-27 DIAGNOSIS — N1831 Chronic kidney disease, stage 3a: Secondary | ICD-10-CM | POA: Insufficient documentation

## 2022-04-27 LAB — URINALYSIS, ROUTINE W REFLEX MICROSCOPIC
Bilirubin Urine: NEGATIVE
Glucose, UA: NEGATIVE mg/dL
Hgb urine dipstick: NEGATIVE
Ketones, ur: NEGATIVE mg/dL
Leukocytes,Ua: NEGATIVE
Nitrite: NEGATIVE
Protein, ur: NEGATIVE mg/dL
Specific Gravity, Urine: 1.009 (ref 1.005–1.030)
pH: 7 (ref 5.0–8.0)

## 2022-04-27 LAB — CBC WITH DIFFERENTIAL/PLATELET
Abs Immature Granulocytes: 0.02 10*3/uL (ref 0.00–0.07)
Basophils Absolute: 0 10*3/uL (ref 0.0–0.1)
Basophils Relative: 0 %
Eosinophils Absolute: 0.1 10*3/uL (ref 0.0–0.5)
Eosinophils Relative: 2 %
HCT: 42.5 % (ref 39.0–52.0)
Hemoglobin: 14.2 g/dL (ref 13.0–17.0)
Immature Granulocytes: 0 %
Lymphocytes Relative: 34 %
Lymphs Abs: 1.8 10*3/uL (ref 0.7–4.0)
MCH: 32.4 pg (ref 26.0–34.0)
MCHC: 33.4 g/dL (ref 30.0–36.0)
MCV: 97 fL (ref 80.0–100.0)
Monocytes Absolute: 0.3 10*3/uL (ref 0.1–1.0)
Monocytes Relative: 6 %
Neutro Abs: 3 10*3/uL (ref 1.7–7.7)
Neutrophils Relative %: 58 %
Platelets: 230 10*3/uL (ref 150–400)
RBC: 4.38 MIL/uL (ref 4.22–5.81)
RDW: 14 % (ref 11.5–15.5)
WBC: 5.3 10*3/uL (ref 4.0–10.5)
nRBC: 0 % (ref 0.0–0.2)

## 2022-04-27 LAB — PROTEIN / CREATININE RATIO, URINE
Creatinine, Urine: 58 mg/dL
Total Protein, Urine: <6 mg/dL

## 2022-04-27 LAB — RENAL FUNCTION PANEL
Albumin: 4.1 g/dL (ref 3.5–5.0)
Anion gap: 8 (ref 5–15)
BUN: 15 mg/dL (ref 6–20)
CO2: 26 mmol/L (ref 22–32)
Calcium: 9.1 mg/dL (ref 8.9–10.3)
Chloride: 106 mmol/L (ref 98–111)
Creatinine, Ser: 1.23 mg/dL (ref 0.61–1.24)
GFR, Estimated: >60 mL/min (ref >60)
Glucose, Bld: 108 mg/dL — ABNORMAL HIGH (ref 70–99)
Phosphorus: 2.8 mg/dL (ref 2.5–4.6)
Potassium: 4.5 mmol/L (ref 3.5–5.1)
Sodium: 140 mmol/L (ref 135–145)

## 2022-04-28 LAB — PARATHYROID HORMONE, INTACT (NO CA): PTH: 36 pg/mL (ref 15–65)

## 2022-05-18 ENCOUNTER — Telehealth: Payer: Self-pay | Admitting: Urology

## 2022-05-18 ENCOUNTER — Other Ambulatory Visit (HOSPITAL_COMMUNITY): Payer: Self-pay

## 2022-05-18 ENCOUNTER — Other Ambulatory Visit: Payer: Self-pay

## 2022-05-18 MED ORDER — CLOMIPHENE CITRATE 50 MG PO TABS
25.0000 mg | ORAL_TABLET | Freq: Every day | ORAL | 0 refills | Status: DC
  Filled 2022-05-18: qty 45, 90d supply, fill #0

## 2022-05-19 ENCOUNTER — Other Ambulatory Visit (HOSPITAL_COMMUNITY): Payer: Self-pay

## 2022-05-19 ENCOUNTER — Other Ambulatory Visit: Payer: Self-pay

## 2022-05-19 ENCOUNTER — Other Ambulatory Visit: Payer: Self-pay | Admitting: *Deleted

## 2022-05-19 MED ORDER — CLOMIPHENE CITRATE 50 MG PO TABS: 25.0000 mg | ORAL_TABLET | Freq: Every day | ORAL | 0 refills | Status: DC

## 2022-05-19 NOTE — Telephone Encounter (Signed)
Pt stopped by office and needs a refill sent to pharmacy in [place], he says this is the only place he can get this filled.  He said you were wonderful and you helped him with this same thing back in October.

## 2022-05-20 ENCOUNTER — Other Ambulatory Visit: Payer: Self-pay

## 2022-05-21 ENCOUNTER — Encounter: Payer: Self-pay | Admitting: Urology

## 2022-05-24 ENCOUNTER — Other Ambulatory Visit: Payer: Self-pay

## 2022-05-24 ENCOUNTER — Other Ambulatory Visit (HOSPITAL_COMMUNITY): Payer: Self-pay

## 2022-05-24 ENCOUNTER — Encounter: Payer: Self-pay | Admitting: Pharmacist

## 2022-05-24 ENCOUNTER — Other Ambulatory Visit: Payer: Self-pay | Admitting: Family Medicine

## 2022-05-24 MED ORDER — CLOMIPHENE CITRATE 50 MG PO TABS
25.0000 mg | ORAL_TABLET | Freq: Every day | ORAL | 0 refills | Status: DC
  Filled 2022-05-24: qty 45, 90d supply, fill #0
  Filled 2022-08-16: qty 45, 90d supply, fill #1

## 2022-08-16 ENCOUNTER — Other Ambulatory Visit (HOSPITAL_COMMUNITY): Payer: Self-pay

## 2022-08-23 ENCOUNTER — Other Ambulatory Visit (HOSPITAL_COMMUNITY): Payer: Self-pay

## 2022-08-26 ENCOUNTER — Other Ambulatory Visit: Payer: Self-pay | Admitting: *Deleted

## 2022-08-26 DIAGNOSIS — E291 Testicular hypofunction: Secondary | ICD-10-CM

## 2022-08-27 ENCOUNTER — Other Ambulatory Visit: Payer: BLUE CROSS/BLUE SHIELD

## 2022-08-27 DIAGNOSIS — E291 Testicular hypofunction: Secondary | ICD-10-CM

## 2022-08-28 LAB — CREATININE, SERUM
Creatinine, Ser: 1.23 mg/dL (ref 0.76–1.27)
eGFR: 70 mL/min/{1.73_m2} (ref >59)

## 2022-08-28 LAB — HEMOGLOBIN AND HEMATOCRIT, BLOOD
Hematocrit: 39.1 % (ref 37.5–51.0)
Hemoglobin: 12.9 g/dL — ABNORMAL LOW (ref 13.0–17.7)

## 2022-08-28 LAB — PSA: Prostate Specific Ag, Serum: 1.6 ng/mL (ref 0.0–4.0)

## 2022-08-28 LAB — TESTOSTERONE: Testosterone: 560 ng/dL (ref 264–916)

## 2022-09-01 ENCOUNTER — Ambulatory Visit: Payer: BC Managed Care – PPO | Admitting: Urology

## 2022-09-06 ENCOUNTER — Ambulatory Visit: Payer: BLUE CROSS/BLUE SHIELD | Admitting: Urology

## 2022-09-06 ENCOUNTER — Encounter: Payer: Self-pay | Admitting: Urology

## 2022-09-06 VITALS — BP 126/80 | HR 65 | Ht 74.0 in | Wt 240.0 lb

## 2022-09-06 DIAGNOSIS — E291 Testicular hypofunction: Secondary | ICD-10-CM

## 2022-09-06 DIAGNOSIS — N401 Enlarged prostate with lower urinary tract symptoms: Secondary | ICD-10-CM

## 2022-09-06 NOTE — Progress Notes (Signed)
    I, Maysun L Gibbs,acting as a scribe for Riki Altes, MD.,have documented all relevant documentation on the behalf of Riki Altes, MD,as directed by  Riki Altes, MD while in the presence of Riki Altes, MD.  09/06/2022 11:07 AM   Connor Stephens [birth year] [id number]  Referring provider: Jerl Mina, MD [address] [facility] [place],  [place] [address]  Chief Complaint  Patient presents with   Hypogonadism   Urologic history 1. Hypogonadism Clomid 25 mg daily  HPI: Connor Stephens is a 57 y.o. male presents for annual follow-up.  Doing well on Clomid Good energy level and libido 6 month interim labs look good Labs drawn 08/27/2022 testosterone 516 ng/dL, PSA stable 1.6, hematocrit stable 39.1. Stable lower urinary tract symptoms of decreased urinary stream and urgency,  Liver function tests ordered by PCP January 2024 were normal.  PSA trend  Prostate Specific Ag, Serum  Latest Ref Rng 0.0 - 4.0 ng/mL  02/15/2020 1.5   08/21/2020 1.4   02/25/2021 1.7   08/21/2021 1.3   02/26/2022 1.0   08/27/2022 1.6     Home Medications:  Allergies as of 09/06/2022   No Known Allergies      Medication List        Accurate as of Sep 06, 2022 11:07 AM. If you have any questions, ask your nurse or doctor.          STOP taking these medications    sildenafil 20 MG tablet Commonly known as: REVATIO Stopped by: Riki Altes, MD       TAKE these medications    aspirin EC 81 MG tablet Take by mouth.   Clomid 50 MG tablet Generic drug: clomiPHENE Take 0.5 tablets (25 mg total) by mouth daily.   pantoprazole 20 MG tablet Commonly known as: PROTONIX Take 20 mg by mouth daily.        Allergies: No Known Allergies   Social History:  reports that he has never smoked. He has never used smokeless tobacco. He reports current alcohol use. He reports that he does not use drugs.   Physical Exam: BP 126/80   Pulse 65   Ht 6\' 2"  (1.88  m)   Wt 240 lb (108.9 kg)   BMI 30.81 kg/m   Constitutional:  Alert and oriented, No acute distress. HEENT: [place] AT, moist mucus membranes.  Trachea midline, no masses. Cardiovascular: No clubbing, cyanosis, or edema. Respiratory: Normal respiratory effort, no increased work of breathing. GI: Abdomen is soft, nontender, nondistended, no abdominal masses Skin: No rashes, bruises or suspicious lesions. Neurologic: Grossly intact, no focal deficits, moving all 4 extremities. Psychiatric: Normal mood and affect.   Assessment & Plan:    1. Hypogonadism. Doing well on Clomid. 6 month lab visit, testosterone/hematocrit 1 year office visit, testosterone, PSA, hematocrit  2. LUTS  Stable Symptoms not bothersome enough that he desires medical management.  [facility] [address] [place], [place] [address] [phone]

## 2022-11-11 ENCOUNTER — Other Ambulatory Visit: Payer: Self-pay | Admitting: Urology

## 2022-11-14 MED ORDER — CLOMID 50 MG PO TABS
25.0000 mg | ORAL_TABLET | Freq: Every day | ORAL | 0 refills | Status: DC
  Filled 2022-11-14 – 2022-11-15 (×2): qty 15, 30d supply, fill #0
  Filled 2022-12-15: qty 15, 30d supply, fill #1
  Filled 2023-01-11: qty 15, 30d supply, fill #2

## 2022-11-15 ENCOUNTER — Other Ambulatory Visit: Payer: Self-pay

## 2022-11-15 ENCOUNTER — Other Ambulatory Visit (HOSPITAL_COMMUNITY): Payer: Self-pay

## 2022-11-22 ENCOUNTER — Telehealth: Payer: Self-pay | Admitting: Urology

## 2022-11-22 NOTE — Telephone Encounter (Signed)
Patient dropped in office today and said that he has new insurance [facility]), and that going forward all refills for Clomid need to be for 30 days, instead of 90 days.

## 2022-12-15 ENCOUNTER — Other Ambulatory Visit (HOSPITAL_COMMUNITY): Payer: Self-pay

## 2022-12-16 ENCOUNTER — Other Ambulatory Visit (HOSPITAL_COMMUNITY): Payer: Self-pay

## 2023-01-11 ENCOUNTER — Other Ambulatory Visit: Payer: Self-pay

## 2023-01-12 ENCOUNTER — Other Ambulatory Visit (HOSPITAL_COMMUNITY): Payer: Self-pay

## 2023-02-14 ENCOUNTER — Other Ambulatory Visit: Payer: Self-pay | Admitting: Urology

## 2023-02-16 ENCOUNTER — Other Ambulatory Visit (HOSPITAL_COMMUNITY): Payer: Self-pay

## 2023-02-16 DIAGNOSIS — M109 Gout, unspecified: Secondary | ICD-10-CM | POA: Insufficient documentation

## 2023-02-16 MED ORDER — CLOMID 50 MG PO TABS
25.0000 mg | ORAL_TABLET | Freq: Every day | ORAL | 0 refills | Status: DC
  Filled 2023-02-16: qty 14, 28d supply, fill #0
  Filled 2023-03-14: qty 14, 28d supply, fill #1
  Filled 2023-04-25: qty 14, 28d supply, fill #2
  Filled 2023-05-23: qty 14, 28d supply, fill #3

## 2023-03-03 ENCOUNTER — Other Ambulatory Visit: Payer: Self-pay | Admitting: *Deleted

## 2023-03-03 DIAGNOSIS — E291 Testicular hypofunction: Secondary | ICD-10-CM

## 2023-03-07 ENCOUNTER — Encounter: Payer: Self-pay | Admitting: Urology

## 2023-03-09 ENCOUNTER — Other Ambulatory Visit: Payer: BC Managed Care – PPO

## 2023-03-09 DIAGNOSIS — E291 Testicular hypofunction: Secondary | ICD-10-CM

## 2023-03-10 LAB — HEMOGLOBIN AND HEMATOCRIT, BLOOD
Hematocrit: 42.7 % (ref 37.5–51.0)
Hemoglobin: 14.5 g/dL (ref 13.0–17.7)

## 2023-03-10 LAB — TESTOSTERONE: Testosterone: 649 ng/dL (ref 264–916)

## 2023-03-14 ENCOUNTER — Other Ambulatory Visit (HOSPITAL_COMMUNITY): Payer: Self-pay

## 2023-03-17 ENCOUNTER — Other Ambulatory Visit: Payer: Self-pay | Admitting: Medical Genetics

## 2023-03-17 DIAGNOSIS — Z006 Encounter for examination for normal comparison and control in clinical research program: Secondary | ICD-10-CM

## 2023-03-22 ENCOUNTER — Other Ambulatory Visit
Admission: RE | Admit: 2023-03-22 | Discharge: 2023-03-22 | Disposition: A | Discharge status: Home / Self Care | Payer: Self-pay | Source: Ambulatory Visit | Attending: Medical Genetics | Admitting: Medical Genetics

## 2023-03-22 DIAGNOSIS — Z006 Encounter for examination for normal comparison and control in clinical research program: Secondary | ICD-10-CM

## 2023-04-04 LAB — GENECONNECT MOLECULAR SCREEN: Genetic Analysis Overall Interpretation: NEGATIVE

## 2023-04-25 ENCOUNTER — Other Ambulatory Visit: Payer: Self-pay

## 2023-04-26 ENCOUNTER — Other Ambulatory Visit: Payer: Self-pay

## 2023-04-30 ENCOUNTER — Other Ambulatory Visit (HOSPITAL_COMMUNITY): Payer: Self-pay

## 2023-05-02 ENCOUNTER — Other Ambulatory Visit (HOSPITAL_COMMUNITY): Payer: Self-pay

## 2023-05-04 ENCOUNTER — Other Ambulatory Visit (HOSPITAL_COMMUNITY): Payer: Self-pay

## 2023-05-23 ENCOUNTER — Other Ambulatory Visit: Payer: Self-pay | Admitting: Urology

## 2023-05-23 ENCOUNTER — Other Ambulatory Visit (HOSPITAL_COMMUNITY): Payer: Self-pay

## 2023-05-23 MED ORDER — CLOMIPHENE CITRATE 50 MG PO TABS
25.0000 mg | ORAL_TABLET | Freq: Every day | ORAL | 5 refills | Status: DC
  Filled 2023-05-23: qty 15, 30d supply, fill #0
  Filled 2023-07-01 (×2): qty 15, 30d supply, fill #1
  Filled 2023-07-26: qty 15, 30d supply, fill #2
  Filled 2023-08-29: qty 15, 30d supply, fill #3
  Filled 2023-11-09: qty 15, 30d supply, fill #4
  Filled 2023-12-16 – 2024-01-18 (×2): qty 15, 30d supply, fill #5

## 2023-05-28 ENCOUNTER — Other Ambulatory Visit (HOSPITAL_COMMUNITY): Payer: Self-pay

## 2023-06-21 ENCOUNTER — Other Ambulatory Visit: Payer: Self-pay | Admitting: Nephrology

## 2023-06-21 DIAGNOSIS — N182 Chronic kidney disease, stage 2 (mild): Secondary | ICD-10-CM | POA: Insufficient documentation

## 2023-06-21 DIAGNOSIS — N281 Cyst of kidney, acquired: Secondary | ICD-10-CM

## 2023-06-21 NOTE — Progress Notes (Signed)
 Follow Up Visit   Patient Name: Connor Stephens, male   Patient DOB: February 12, 1966 Date of Service: 06/21/2023  Patient MRN: [id number] Provider Creating Note: Pinkey Edman, MD  [phone] Primary Care Physician:   [address] [place] [address] Additional Physicians/ Providers:    History of Present Illness Connor Stephens is a 58 y.o. male now comes to the office for follow-up.  He has a past medical history of hypogonadism, chronic kidney disease and renal cysts now comes for renal follow-up.   Patient has been feeling well. He changed his dietary habits. He denies any use of nonsteroidal anti-inflammatory drugs. The most recent blood work showed a GFR of 70 cc/min.  He lost 15 pounds of weight since last visit.  Medications   Current Outpatient Medications:  .  pantoprazole (PROTONIX) 20 MG EC tablet, Take 20 mg by mouth in the morning., Disp: , Rfl:  .  aspirin (ST JOSEPH) 81 MG EC tablet, Take 81 mg by mouth daily, Disp: , Rfl:  .  CLOMIPHENE  CITRATE PO, Take 25 mg by mouth in the morning., Disp: , Rfl:  .  Multiple Vitamin (multivitamin) capsule, Take 1 capsule by mouth daily, Disp: , Rfl:    Allergies Patient has no known allergies.  Problem List Patient Active Problem List  Diagnosis  . Chronic kidney disease stage 3A (HCC)  . Aquired multiple cysts of kidney  . Proteinuria, not otherwise specified  . Chronic kidney disease, stage 2 (mild)     Review of Systems  Constitutional: Negative.   HENT: Negative.    Eyes: Negative.   Respiratory: Negative.    Cardiovascular: Negative.   Gastrointestinal: Negative.   Genitourinary: Negative.   Musculoskeletal: Negative.   Skin: Negative.      History Past Medical History:  Diagnosis Date  . Decreased testosterone  level   . Esophageal reflux     Past Surgical History:  Procedure Laterality Date  . COLONOSCOPY    . ESOPHAGOGASTRODUODENOSCOPY     Family History  Problem Relation Age of Onset  .  Cancer Mother   . Cancer Father   . Cancer Father's Brother   . Cancer Paternal Grandfather    Social History   Tobacco Use  . Smoking status: Never  . Smokeless tobacco: Never  Substance Use Topics  . Alcohol use: Not Currently        Physical Exam  Vitals BP 144/86 (BP Location: Right upper arm, Patient Position: Sitting)   Pulse 68   Temp 98.7 F   Wt 253 lb 3.2 oz (115 kg)   SpO2 98%   BMI 32.51 kg/m   PHYSICAL EXAM: General appearance: well developed, well nourished, NAD Neck: Trachea midline; supple Lungs: CTAB, with normal respiratory effort  CV: S1S2, no murmurs or rubs. Abdomen: Soft, non-tender; bowel sounds present Extremities: No peripheral edema   Laboratory Studies   Chemistry  Lab Units 04/28/23 0856 04/27/22 0827 10/08/21 1613 10/05/21 1144  SODIUM mmol/L 141 141 142  --   POTASSIUM mmol/L 4.8 4.4 4.5  --   CHLORIDE mmol/L 107 105 108  --   CO2 mmol/L 29.3 28.8 25  --   CALCIUM mg/dL 8.9 9.1 9.2  --   PHOSPHORUS mg/dL  --   --  4.4  --   ALK PHOS U/L 46 41  --   --   PTH pg/mL  --   --   --  54  GLUCOSE mg/dL 96 99 95  --  ALBUMIN g/dL 4.2 4.3 4.4  --   BUN mg/dL 13 15 18   --   CREATININE mg/dL 1.2 1.1 8.27*  --     CBC  Lab Units 10/08/21 1613 10/05/21 1144  WBC AUTO Thousand/uL 6.5  --   HEMOGLOBIN g/dL 86.5  --   HEMOGLOBIN URINE   --  NEGATIVE  HEMATOCRIT % 39.0  --   MCV fL 99.2  --   PLATELETS AUTO Thousand/uL 235  --     Urine  Lab Units 10/05/21 1144  COLOR U  YELLOW  KETONES U MG/DL  NEGATIVE  PROT/CREAT RATIO UR mg/g creat 0.060  60    Imaging and Other Studies  RENAL / URINARY TRACT ULTRASOUND COMPLETE   COMPARISON:  None.   FINDINGS:  Right Kidney:   Renal measurements: 14.1 x 6.3 x 6.3 cm = volume: 289.4 mL.  Echogenicity within normal limits. No hydronephrosis visualized. 2.3  cm inferior pole simple cyst.   Left Kidney:   Renal measurements: 14.8 x 6.0 x 5.7 cm = volume: 261.1 mL.   Echogenicity within normal limits. No hydronephrosis visualized.  Multiple simple renal cysts measuring up to 5.0 cm within the upper  pole.   Bladder:   Appears normal for degree of bladder distention.   Other:   None.   IMPRESSION:  Bilateral renal cysts.   Otherwise unremarkable ultrasound.    Electronically Signed    By: Christene Morones M.D.    On: 05/21/2020 16:04   Problem List Items Addressed This Visit     Aquired multiple cysts of kidney - Primary   Proteinuria, not otherwise specified   Chronic kidney disease, stage 2 (mild) (Chronic)   Orders Placed This Encounter  . Ultrasound renal complete        Impression/Recommendations   Connor Stephens is a 58 y.o. male with a past medical history of hypogonadism, chronic kidney disease and now found to have simple renal cysts comes for renal follow-up.     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  Patient has a GFR of about 70 cc/min.  He has no evidence of hypertension or diabetes.  We will continue to monitor closely.   #2: Renal cysts: They seem to be simple cysts.  Will repeat renal sonogram this year.     #3: Overweight: I advised him on the importance of losing weight and the benefits of exercise.   #4: Proteinuria: The most recent urinalysis did not show any evidence of protein in the urine.   Patient is advised to continue exercising daily and stay on regular diet.  Weight loss advised.   He Is advised to avoid nonsteroidal anti-inflammatory drugs.  Dear Dr. Valora, Thank you for the opportunity to participate in the care of this very pleasant patient.  I discussed the assessment and treatment plan with the patient.  The patient was provided an opportunity to ask questions and all were answered.  The patient agreed with the plan and demonstrated an understanding of the instructions. The patient was advised to call back or seek an in-person evaluation if the symptoms worsen or  if the condition fails to improve as anticipated.  Return in about 2 years (around 06/20/2025).  Disclaimer: Much of the narrative of this dictation was acquired using speech recognition software. It is possible that some dictated speech was not transcribed accurately by this system nor detected in the proofing. Such errors are at times unavoidable.    Pinkey Edman, MD [facility] Ph: [phone] Fax: [phone] 06/21/2023

## 2023-07-01 ENCOUNTER — Other Ambulatory Visit (HOSPITAL_COMMUNITY): Payer: Self-pay

## 2023-07-02 ENCOUNTER — Other Ambulatory Visit (HOSPITAL_COMMUNITY): Payer: Self-pay

## 2023-07-26 ENCOUNTER — Other Ambulatory Visit (HOSPITAL_COMMUNITY): Payer: Self-pay

## 2023-07-30 ENCOUNTER — Other Ambulatory Visit (HOSPITAL_COMMUNITY): Payer: Self-pay

## 2023-08-29 ENCOUNTER — Other Ambulatory Visit: Payer: Self-pay

## 2023-09-05 ENCOUNTER — Other Ambulatory Visit: Payer: Self-pay

## 2023-09-05 DIAGNOSIS — E291 Testicular hypofunction: Secondary | ICD-10-CM

## 2023-09-05 DIAGNOSIS — N401 Enlarged prostate with lower urinary tract symptoms: Secondary | ICD-10-CM

## 2023-09-06 ENCOUNTER — Other Ambulatory Visit: Payer: Self-pay

## 2023-09-06 DIAGNOSIS — E291 Testicular hypofunction: Secondary | ICD-10-CM

## 2023-09-06 DIAGNOSIS — N401 Enlarged prostate with lower urinary tract symptoms: Secondary | ICD-10-CM

## 2023-09-07 LAB — PSA: Prostate Specific Ag, Serum: 1.6 ng/mL (ref 0.0–4.0)

## 2023-09-07 LAB — HEMATOCRIT: Hematocrit: 42 % (ref 37.5–51.0)

## 2023-09-07 LAB — TESTOSTERONE: Testosterone: 587 ng/dL (ref 264–916)

## 2023-09-09 ENCOUNTER — Encounter: Payer: Self-pay | Admitting: Urology

## 2023-09-09 ENCOUNTER — Ambulatory Visit: Payer: Self-pay | Admitting: Urology

## 2023-09-09 VITALS — BP 119/80 | HR 71 | Ht 74.0 in | Wt 246.0 lb

## 2023-09-09 DIAGNOSIS — N401 Enlarged prostate with lower urinary tract symptoms: Secondary | ICD-10-CM

## 2023-09-09 DIAGNOSIS — E291 Testicular hypofunction: Secondary | ICD-10-CM | POA: Diagnosis not present

## 2023-09-09 NOTE — Progress Notes (Signed)
    I, Maysun Jamey Mccallum, acting as a Neurosurgeon for Connor Knapp, MD., have documented all relevant documentation on the behalf of Connor Knapp, MD, as directed by Connor Knapp, MD while in the presence of Connor Knapp, MD.  Discussed the use of AI scribe software for clinical note transcription with the patient, who gave verbal consent to proceed.   09/09/2023 2:29 PM   Areatha Beecham [birth year] [id number]  Referring provider: Lyle San, MD [address] [facility] [place],  [place] [address]  Chief Complaint  Patient presents with   Follow-up   Urological history 1. Hypogonadism - Clomid  25 mg daily  HPI: Diquan G Cornfield is a 58 y.o. male presents for annual follow-up.  Doing well on Clomid  Good enery level and libido Lab update 09/06/2023 testosterone  587, a PSA stable 1.6, hematocrit 42.0; LFTs performed January 2025 were normal. He has noted some increased urgency since last year's visit, but symptoms are still not bothersome enough that he desires medication.  He does state that since being on Clomid , he does have breast tenderness/enlargement.  PSA trend   Prostate Specific Ag, Serum  Latest Ref Rng 0.0 - 4.0 ng/mL  02/15/2020 1.5   08/21/2020 1.4   02/25/2021 1.7   08/21/2021 1.3   02/26/2022 1.0   08/27/2022 1.6   09/06/2023 1.6      Home Medications:  Allergies as of 09/09/2023   No Known Allergies      Medication List        Accurate as of Sep 09, 2023  2:29 PM. If you have any questions, ask your nurse or doctor.          aspirin EC 81 MG tablet Take by mouth.   Clomid  50 MG tablet Generic drug: clomiPHENE  Take 1/2 tablet (25 mg) by mouth daily.   pantoprazole 20 MG tablet Commonly known as: PROTONIX Take 20 mg by mouth daily.        Allergies: No Known Allergies  Social History:  reports that he has never smoked. He has never used smokeless tobacco. He reports current alcohol use. He reports that he does not use  drugs.   Physical Exam: BP 119/80   Pulse 71   Ht 6\' 2"  (1.88 m)   Wt 246 lb (111.6 kg)   BMI 31.58 kg/m   Constitutional:  Alert and oriented, No acute distress. HEENT: [place] AT, moist mucus membranes.  Trachea midline, no masses. Cardiovascular: No clubbing, cyanosis, or edema. Respiratory: Normal respiratory effort, no increased work of breathing. GI: Abdomen is soft, nontender, nondistended, no abdominal masses Skin: No rashes, bruises or suspicious lesions. Neurologic: Grossly intact, no focal deficits, moving all 4 extremities. Psychiatric: Normal mood and affect.   Assessment & Plan:    1. Hypogonadism He states Clomid  has become expensive and he is considering changing to TRT. We discussed options of a testosterone  pills, topical gels, and intramuscular injections. He will think this over and let us  know if he wants to switch.  Add on estradiol level on labs drawn earlier this week.   2. BPH with lower urinary tract symptoms The patient reports some increased urgency but symptoms are not bothersome enough to warrant medical management at this time.  I have reviewed the above documentation for accuracy and completeness, and I agree with the above.   Connor Knapp, MD  [facility] [address] [place], [place] [address] [phone]

## 2023-10-01 ENCOUNTER — Ambulatory Visit: Payer: Self-pay | Admitting: Urology

## 2023-10-01 LAB — ESTRADIOL: Estradiol: 26.9 pg/mL (ref 7.6–42.6)

## 2023-10-01 LAB — SPECIMEN STATUS REPORT

## 2023-11-09 ENCOUNTER — Other Ambulatory Visit: Payer: Self-pay

## 2023-12-06 ENCOUNTER — Ambulatory Visit
Admission: RE | Admit: 2023-12-06 | Discharge: 2023-12-06 | Disposition: A | Discharge status: Home / Self Care | Source: Ambulatory Visit | Attending: Nephrology | Admitting: Nephrology

## 2023-12-06 DIAGNOSIS — N281 Cyst of kidney, acquired: Secondary | ICD-10-CM | POA: Insufficient documentation

## 2023-12-06 DIAGNOSIS — N182 Chronic kidney disease, stage 2 (mild): Secondary | ICD-10-CM | POA: Insufficient documentation

## 2023-12-16 ENCOUNTER — Other Ambulatory Visit: Payer: Self-pay

## 2023-12-27 ENCOUNTER — Other Ambulatory Visit: Payer: Self-pay

## 2023-12-28 ENCOUNTER — Other Ambulatory Visit: Payer: Self-pay

## 2023-12-28 ENCOUNTER — Emergency Department

## 2023-12-28 ENCOUNTER — Emergency Department
Admission: EM | Admit: 2023-12-28 | Discharge: 2023-12-28 | Disposition: A | Discharge status: Home / Self Care | Source: Ambulatory Visit | Attending: Emergency Medicine | Admitting: Emergency Medicine

## 2023-12-28 DIAGNOSIS — S2222XA Fracture of body of sternum, initial encounter for closed fracture: Secondary | ICD-10-CM | POA: Diagnosis not present

## 2023-12-28 DIAGNOSIS — N182 Chronic kidney disease, stage 2 (mild): Secondary | ICD-10-CM | POA: Diagnosis not present

## 2023-12-28 DIAGNOSIS — S299XXA Unspecified injury of thorax, initial encounter: Secondary | ICD-10-CM | POA: Diagnosis present

## 2023-12-28 DIAGNOSIS — Y92410 Unspecified street and highway as the place of occurrence of the external cause: Secondary | ICD-10-CM | POA: Insufficient documentation

## 2023-12-28 LAB — COMPREHENSIVE METABOLIC PANEL WITH GFR
ALT: 19 U/L (ref 0–44)
AST: 23 U/L (ref 15–41)
Albumin: 3.6 g/dL (ref 3.5–5.0)
Alkaline Phosphatase: 33 U/L — ABNORMAL LOW (ref 38–126)
Anion gap: 11 (ref 5–15)
BUN: 14 mg/dL (ref 6–20)
CO2: 23 mmol/L (ref 22–32)
Calcium: 7.6 mg/dL — ABNORMAL LOW (ref 8.9–10.3)
Chloride: 110 mmol/L (ref 98–111)
Creatinine, Ser: 0.95 mg/dL (ref 0.61–1.24)
GFR, Estimated: >60 mL/min (ref >60)
Glucose, Bld: 95 mg/dL (ref 70–99)
Potassium: 3.5 mmol/L (ref 3.5–5.1)
Sodium: 144 mmol/L (ref 135–145)
Total Bilirubin: 0.7 mg/dL (ref 0.0–1.2)
Total Protein: 6.2 g/dL — ABNORMAL LOW (ref 6.5–8.1)

## 2023-12-28 LAB — CBC WITH DIFFERENTIAL/PLATELET
Abs Immature Granulocytes: 0.05 K/uL (ref 0.00–0.07)
Basophils Absolute: 0 K/uL (ref 0.0–0.1)
Basophils Relative: 0 %
Eosinophils Absolute: 0.1 K/uL (ref 0.0–0.5)
Eosinophils Relative: 1 %
HCT: 41.3 % (ref 39.0–52.0)
Hemoglobin: 14.1 g/dL (ref 13.0–17.0)
Immature Granulocytes: 1 %
Lymphocytes Relative: 14 %
Lymphs Abs: 1.5 K/uL (ref 0.7–4.0)
MCH: 33.6 pg (ref 26.0–34.0)
MCHC: 34.1 g/dL (ref 30.0–36.0)
MCV: 98.3 fL (ref 80.0–100.0)
Monocytes Absolute: 0.7 K/uL (ref 0.1–1.0)
Monocytes Relative: 6 %
Neutro Abs: 8.4 K/uL — ABNORMAL HIGH (ref 1.7–7.7)
Neutrophils Relative %: 78 %
Platelets: 240 K/uL (ref 150–400)
RBC: 4.2 MIL/uL — ABNORMAL LOW (ref 4.22–5.81)
RDW: 13.5 % (ref 11.5–15.5)
WBC: 10.7 K/uL — ABNORMAL HIGH (ref 4.0–10.5)
nRBC: 0 % (ref 0.0–0.2)

## 2023-12-28 LAB — LIPASE, BLOOD: Lipase: 37 U/L (ref 11–51)

## 2023-12-28 LAB — TROPONIN I (HIGH SENSITIVITY): Troponin I (High Sensitivity): 5 ng/L (ref <18)

## 2023-12-28 MED ORDER — CYCLOBENZAPRINE HCL 5 MG PO TABS: 5.0000 mg | ORAL_TABLET | Freq: Three times a day (TID) | ORAL | 0 refills | Status: AC | PRN

## 2023-12-28 MED ORDER — OXYCODONE-ACETAMINOPHEN 5-325 MG PO TABS: 1.0000 | ORAL_TABLET | Freq: Three times a day (TID) | ORAL | 0 refills | Status: AC | PRN

## 2023-12-28 MED ORDER — CYCLOBENZAPRINE HCL 10 MG PO TABS
10.0000 mg | ORAL_TABLET | Freq: Once | ORAL | Status: AC
  Administered 2023-12-28: 10 mg via ORAL
  Filled 2023-12-28: qty 1

## 2023-12-28 MED ORDER — OXYCODONE-ACETAMINOPHEN 5-325 MG PO TABS
1.0000 | ORAL_TABLET | Freq: Once | ORAL | Status: AC
  Administered 2023-12-28: 1 via ORAL
  Filled 2023-12-28: qty 1

## 2023-12-28 MED ORDER — IBUPROFEN 800 MG PO TABS: 800.0000 mg | ORAL_TABLET | Freq: Three times a day (TID) | ORAL | 0 refills | Status: AC | PRN

## 2023-12-28 MED ORDER — IOHEXOL 300 MG/ML  SOLN
100.0000 mL | Freq: Once | INTRAMUSCULAR | Status: AC | PRN
  Administered 2023-12-28: 100 mL via INTRAVENOUS

## 2023-12-28 MED ORDER — ONDANSETRON 4 MG PO TBDP
4.0000 mg | ORAL_TABLET | Freq: Once | ORAL | Status: AC
  Administered 2023-12-28: 4 mg via ORAL
  Filled 2023-12-28: qty 1

## 2023-12-28 MED ORDER — ONDANSETRON 4 MG PO TBDP: 4.0000 mg | ORAL_TABLET | Freq: Three times a day (TID) | ORAL | 0 refills | Status: AC | PRN

## 2023-12-28 MED ORDER — KETOROLAC TROMETHAMINE 15 MG/ML IJ SOLN
15.0000 mg | Freq: Once | INTRAMUSCULAR | Status: AC
  Administered 2023-12-28: 15 mg via INTRAVENOUS
  Filled 2023-12-28: qty 1

## 2023-12-28 MED ORDER — FENTANYL CITRATE PF 50 MCG/ML IJ SOSY
50.0000 ug | PREFILLED_SYRINGE | Freq: Once | INTRAMUSCULAR | Status: AC
  Administered 2023-12-28: 50 ug via INTRAVENOUS
  Filled 2023-12-28: qty 1
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
## 2023-12-28 NOTE — ED Notes (Signed)
 Pt verbalizes understanding of discharge instructions. Opportunity for questioning and answers were provided. Pt discharged from ED to home with family.
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
## 2023-12-28 NOTE — Discharge Instructions (Addendum)
 Your exam, labs, EKG, chest x-ray, and CT scans are overall reassuring.  No signs of any serious organ damage related to your oxygen.  Your CT scan does confirm a nondisplaced fracture of your sternum.  This injury typically will resolve without surgical intervention.  The injury however does cause significant pain and disability.  We discussed the option of admission to the hospital for ongoing pain management of your acute fracture.  You declined admission/observation at this time, which is your prerogative.  You will be discharged home with prescription pain medicines.  You are advised to take the medicine on schedule as directed.  Return to the ED for worsening symptoms as discussed.  Follow-up with your primary provider for ongoing evaluation.
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
## 2024-01-18 ENCOUNTER — Other Ambulatory Visit: Payer: Self-pay

## 2024-02-14 ENCOUNTER — Other Ambulatory Visit: Payer: Self-pay

## 2024-02-14 ENCOUNTER — Other Ambulatory Visit: Payer: Self-pay | Admitting: Urology

## 2024-02-14 MED ORDER — CLOMIPHENE CITRATE 50 MG PO TABS
25.0000 mg | ORAL_TABLET | Freq: Every day | ORAL | 5 refills | Status: AC
  Filled 2024-02-14 – 2024-04-02 (×3): qty 15, 30d supply, fill #0
  Filled 2024-04-30 – 2024-05-04 (×3): qty 15, 30d supply, fill #1

## 2024-02-27 ENCOUNTER — Other Ambulatory Visit: Payer: Self-pay

## 2024-04-02 ENCOUNTER — Encounter (HOSPITAL_COMMUNITY): Payer: Self-pay | Admitting: Pharmacist

## 2024-04-02 ENCOUNTER — Other Ambulatory Visit (HOSPITAL_COMMUNITY): Payer: Self-pay

## 2024-04-02 ENCOUNTER — Other Ambulatory Visit: Payer: Self-pay

## 2024-04-27 ENCOUNTER — Encounter: Payer: Self-pay | Admitting: Urology

## 2024-04-30 ENCOUNTER — Other Ambulatory Visit: Payer: Self-pay

## 2024-05-04 ENCOUNTER — Other Ambulatory Visit: Payer: Self-pay

## 2024-08-31 ENCOUNTER — Other Ambulatory Visit

## 2024-09-07 ENCOUNTER — Ambulatory Visit: Admitting: Urology
